# Patient Record
Sex: Female | Born: 1955 | Race: Black or African American | Hispanic: No | Marital: Single | Smoking: Former smoker
Health system: Southern US, Community
[De-identification: ages and names within clinical notes are randomized; demographics above are authoritative.]

## PROBLEM LIST (undated history)

## (undated) DIAGNOSIS — E119 Type 2 diabetes mellitus without complications: Secondary | ICD-10-CM

## (undated) DIAGNOSIS — K219 Gastro-esophageal reflux disease without esophagitis: Secondary | ICD-10-CM

## (undated) DIAGNOSIS — N289 Disorder of kidney and ureter, unspecified: Secondary | ICD-10-CM

## (undated) DIAGNOSIS — I2510 Atherosclerotic heart disease of native coronary artery without angina pectoris: Secondary | ICD-10-CM

## (undated) DIAGNOSIS — I10 Essential (primary) hypertension: Secondary | ICD-10-CM

## (undated) HISTORY — PX: CARDIAC SURGERY: SHX584

## (undated) HISTORY — PX: ABDOMINAL HYSTERECTOMY: SHX81

## (undated) HISTORY — PX: CARPAL TUNNEL RELEASE: SHX101

## (undated) HISTORY — PX: TRIGGER FINGER RELEASE: SHX641

## (undated) HISTORY — PX: CORONARY ANGIOPLASTY WITH STENT PLACEMENT: SHX49

## (undated) HISTORY — PX: BACK SURGERY: SHX140

## (undated) HISTORY — PX: REVISION OF SCAR TISSUE RECTUS MUSCLE: SHX2351

---

## 2006-08-03 ENCOUNTER — Other Ambulatory Visit: Payer: Self-pay

## 2006-08-03 ENCOUNTER — Emergency Department: Payer: Self-pay | Admitting: Emergency Medicine

## 2007-03-11 ENCOUNTER — Emergency Department: Payer: Self-pay | Admitting: Emergency Medicine

## 2007-03-11 ENCOUNTER — Other Ambulatory Visit: Payer: Self-pay

## 2007-04-21 ENCOUNTER — Emergency Department: Payer: Self-pay | Admitting: Emergency Medicine

## 2007-04-21 ENCOUNTER — Other Ambulatory Visit: Payer: Self-pay

## 2007-04-22 ENCOUNTER — Ambulatory Visit: Payer: Self-pay | Admitting: Emergency Medicine

## 2007-12-07 ENCOUNTER — Emergency Department: Payer: Self-pay | Admitting: Emergency Medicine

## 2008-01-19 ENCOUNTER — Emergency Department: Payer: Self-pay | Admitting: Emergency Medicine

## 2008-03-08 ENCOUNTER — Emergency Department: Payer: Self-pay | Admitting: Emergency Medicine

## 2008-03-24 ENCOUNTER — Emergency Department: Payer: Self-pay | Admitting: Emergency Medicine

## 2008-03-24 ENCOUNTER — Other Ambulatory Visit: Payer: Self-pay

## 2008-07-03 ENCOUNTER — Emergency Department: Payer: Self-pay | Admitting: Emergency Medicine

## 2008-08-25 ENCOUNTER — Emergency Department: Payer: Self-pay | Admitting: Emergency Medicine

## 2008-09-05 ENCOUNTER — Inpatient Hospital Stay: Payer: Self-pay | Admitting: Specialist

## 2008-10-29 IMAGING — CR DG CHEST 1V PORT
1 series · 1 of 1 positions shown · non-contrast
Comparison: none

REASON FOR EXAM: shortnness of breath and discomfort fo chest
COMMENTS:

PROCEDURE:     DXR - DXR PORTABLE CHEST SINGLE VIEW  - March 11, 2007 [DATE]
RESULT:     Comparison is made to the prior exam of 08/03/2006.  The lung
fields are clear. The heart, mediastinal and osseous structures show no
significant abnormalities.

[view not recorded]
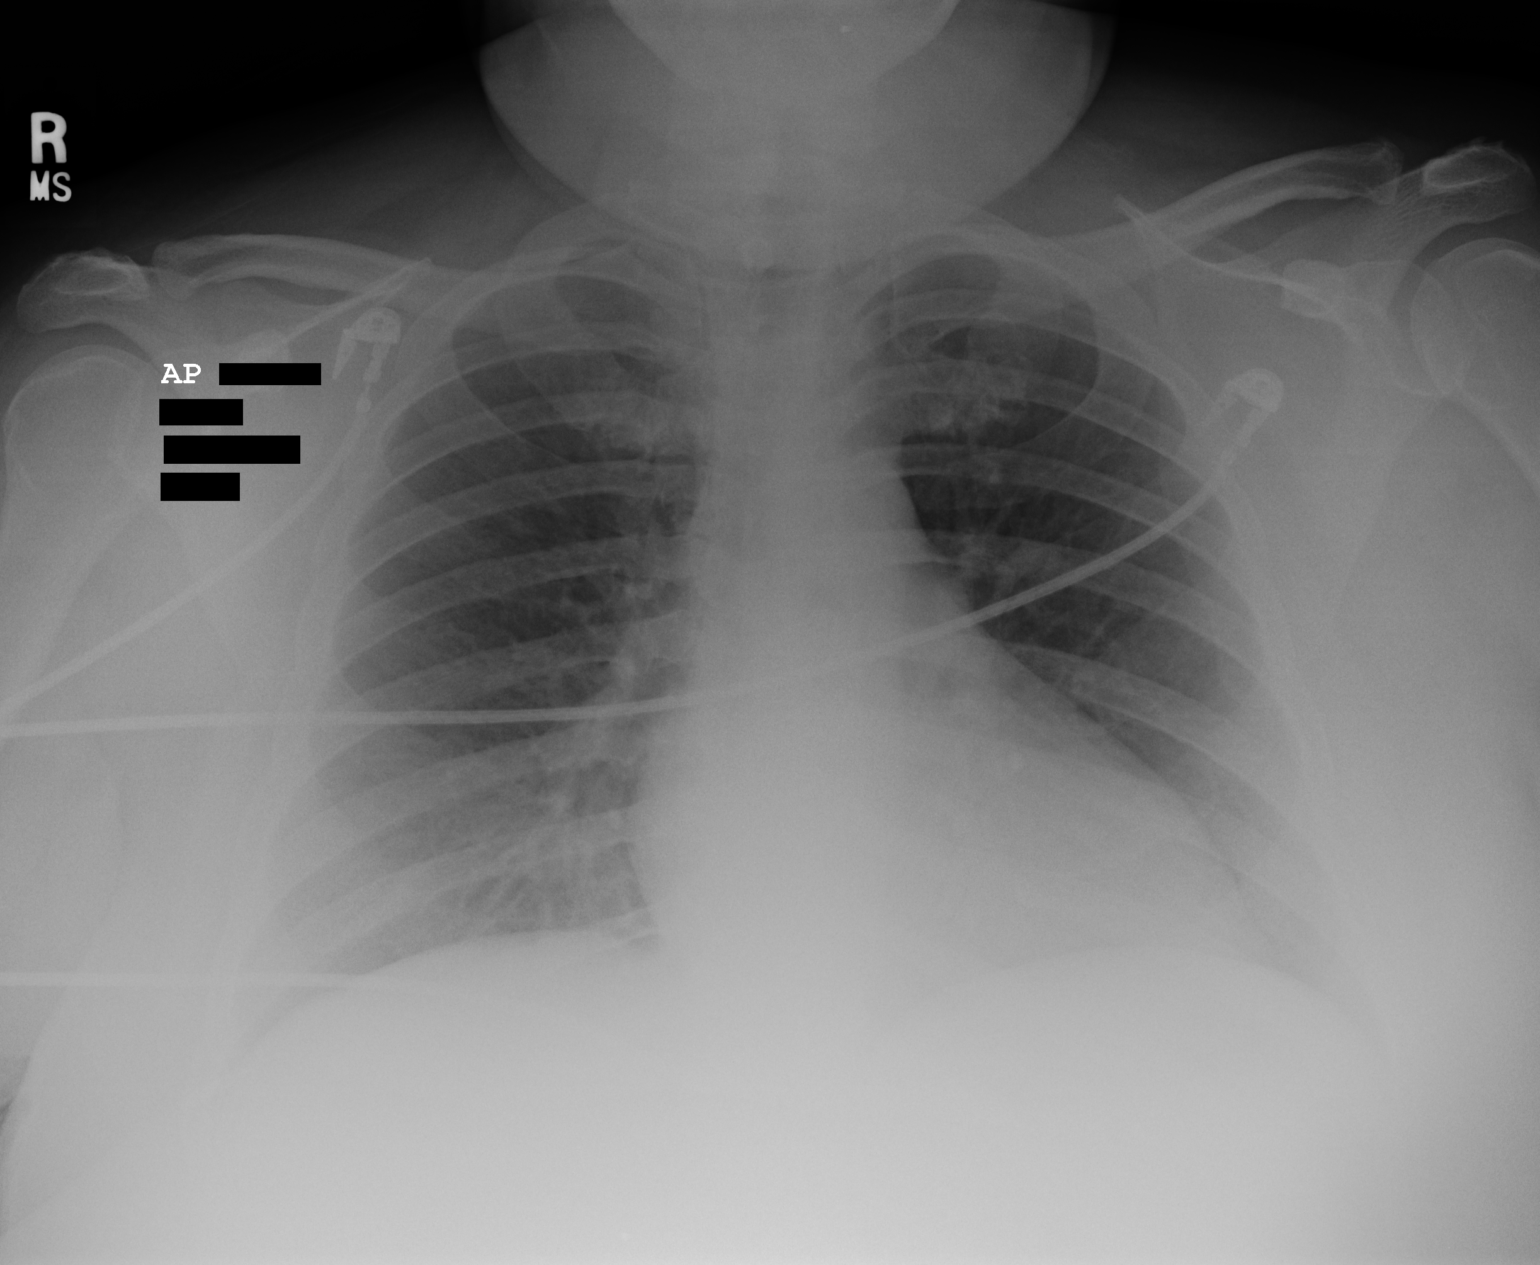

[1 of 1 positions shown; findings below may reference images not displayed]

IMPRESSION: 1.     No significant abnormalities are noted.
2.     Monitoring electrodes are present.

## 2008-12-09 IMAGING — CR DG CHEST 2V
1 series · 3 of 3 positions shown · non-contrast
Comparison: none

REASON FOR EXAM: chest pain
COMMENTS:

PROCEDURE:     DXR - DXR CHEST PA (OR AP) AND LATERAL  - April 21, 2007  [DATE]
RESULT:     Comparison is made to the prior exam of 03/11/2007. The lung
fields are clear. No pneumonia, pneumothorax or pleural effusion is seen.
Heart size is normal.

[Series 1: view not recorded · 0.17mm/px · 3 of 3 slices shown]
[im 1/3]
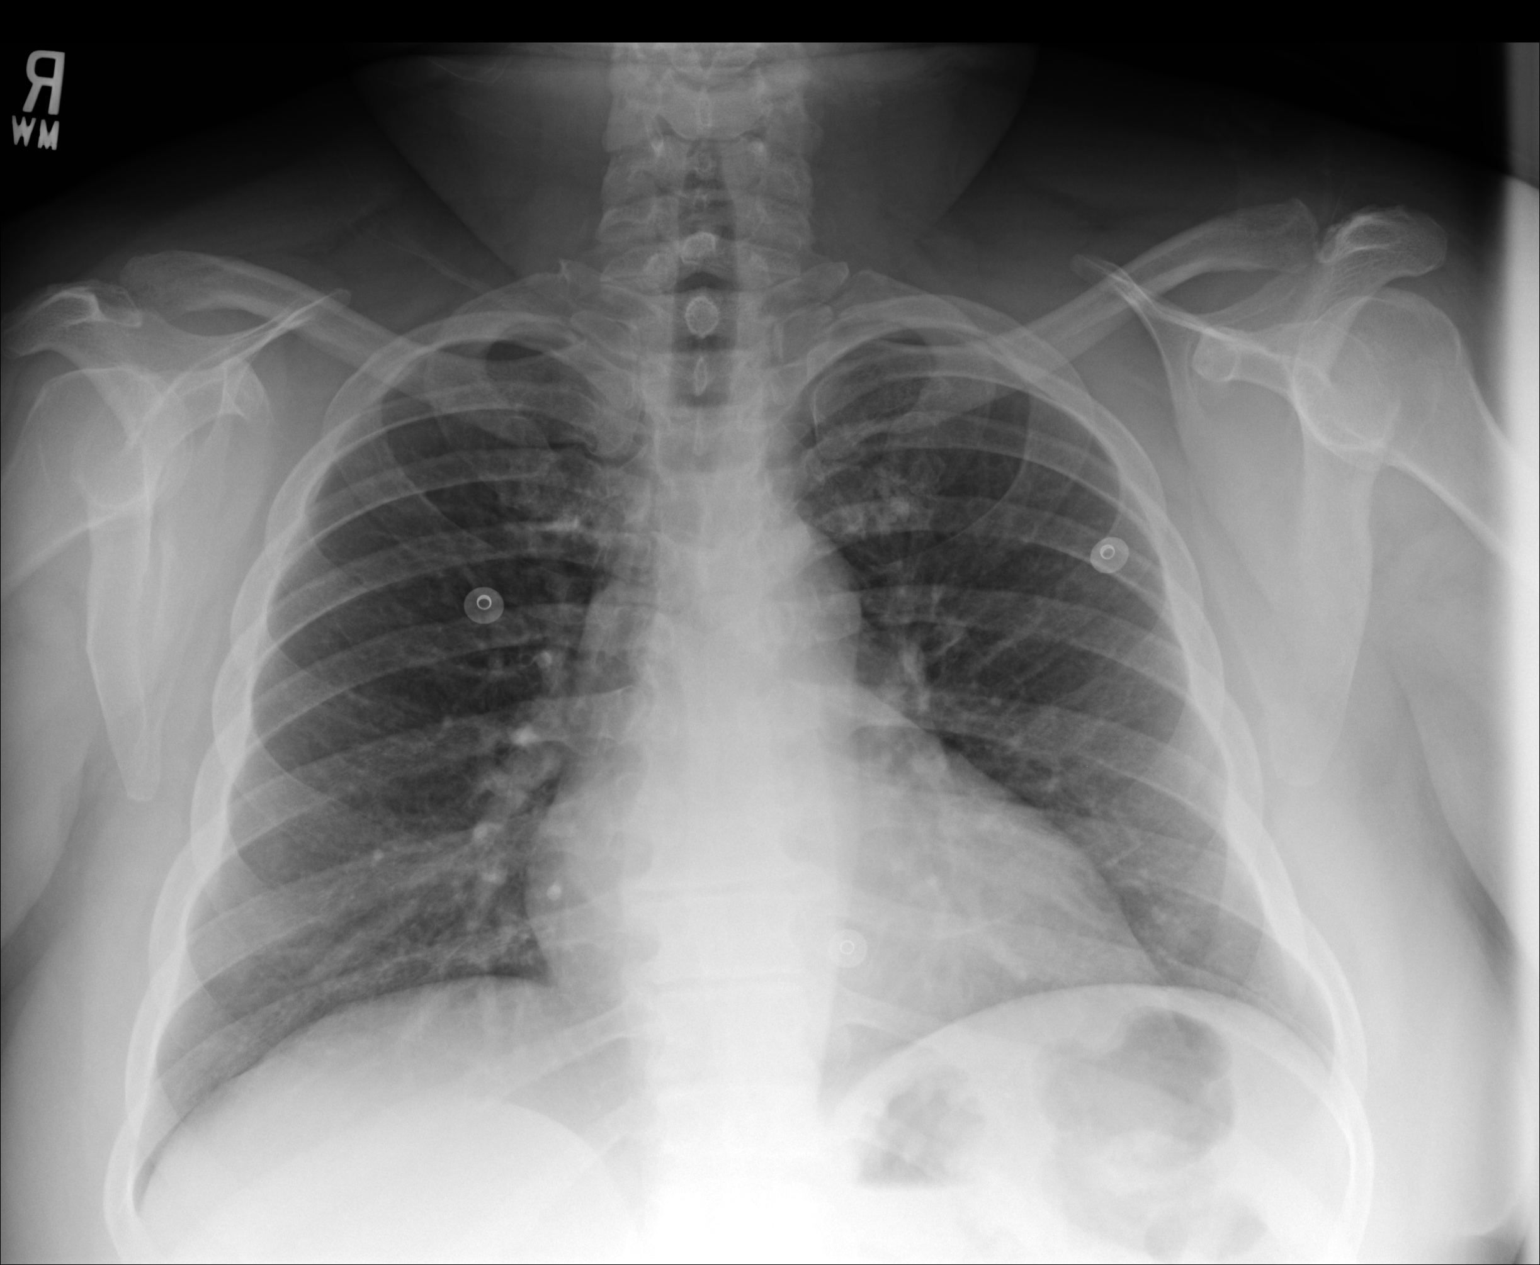
[im 2/3]
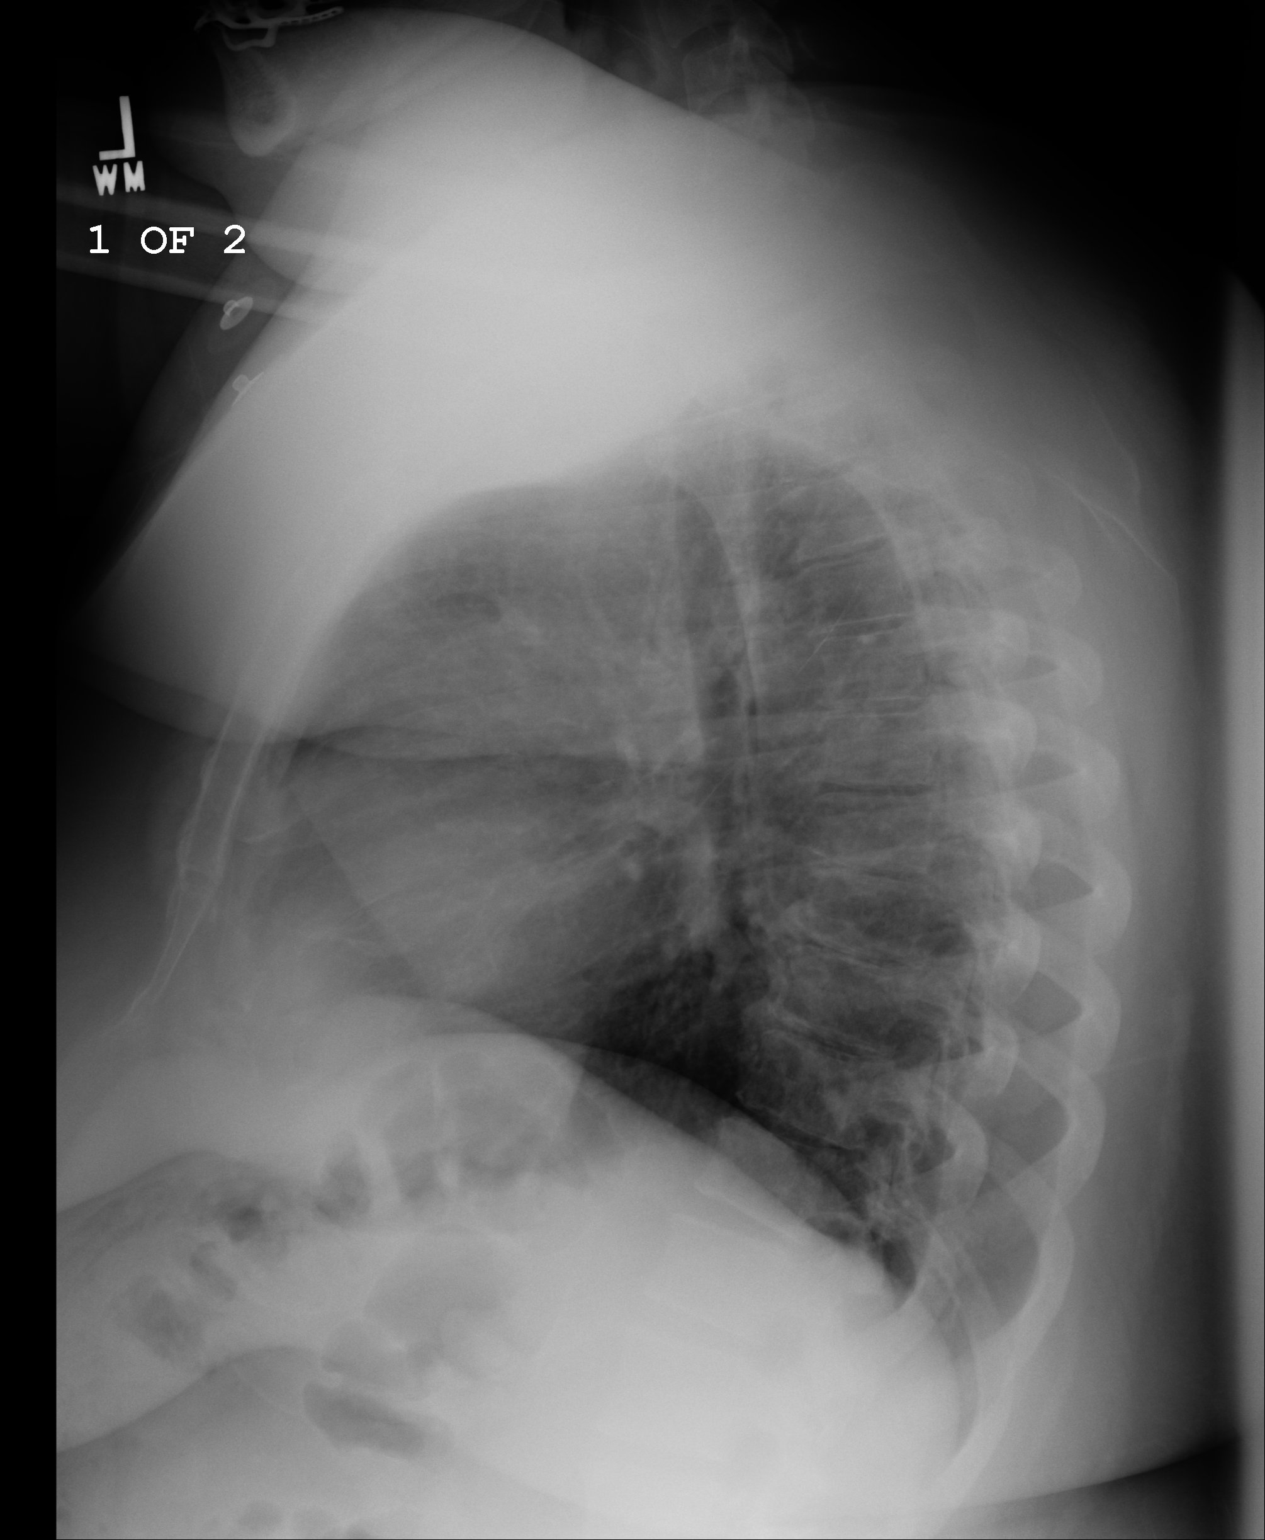
[im 3/3]
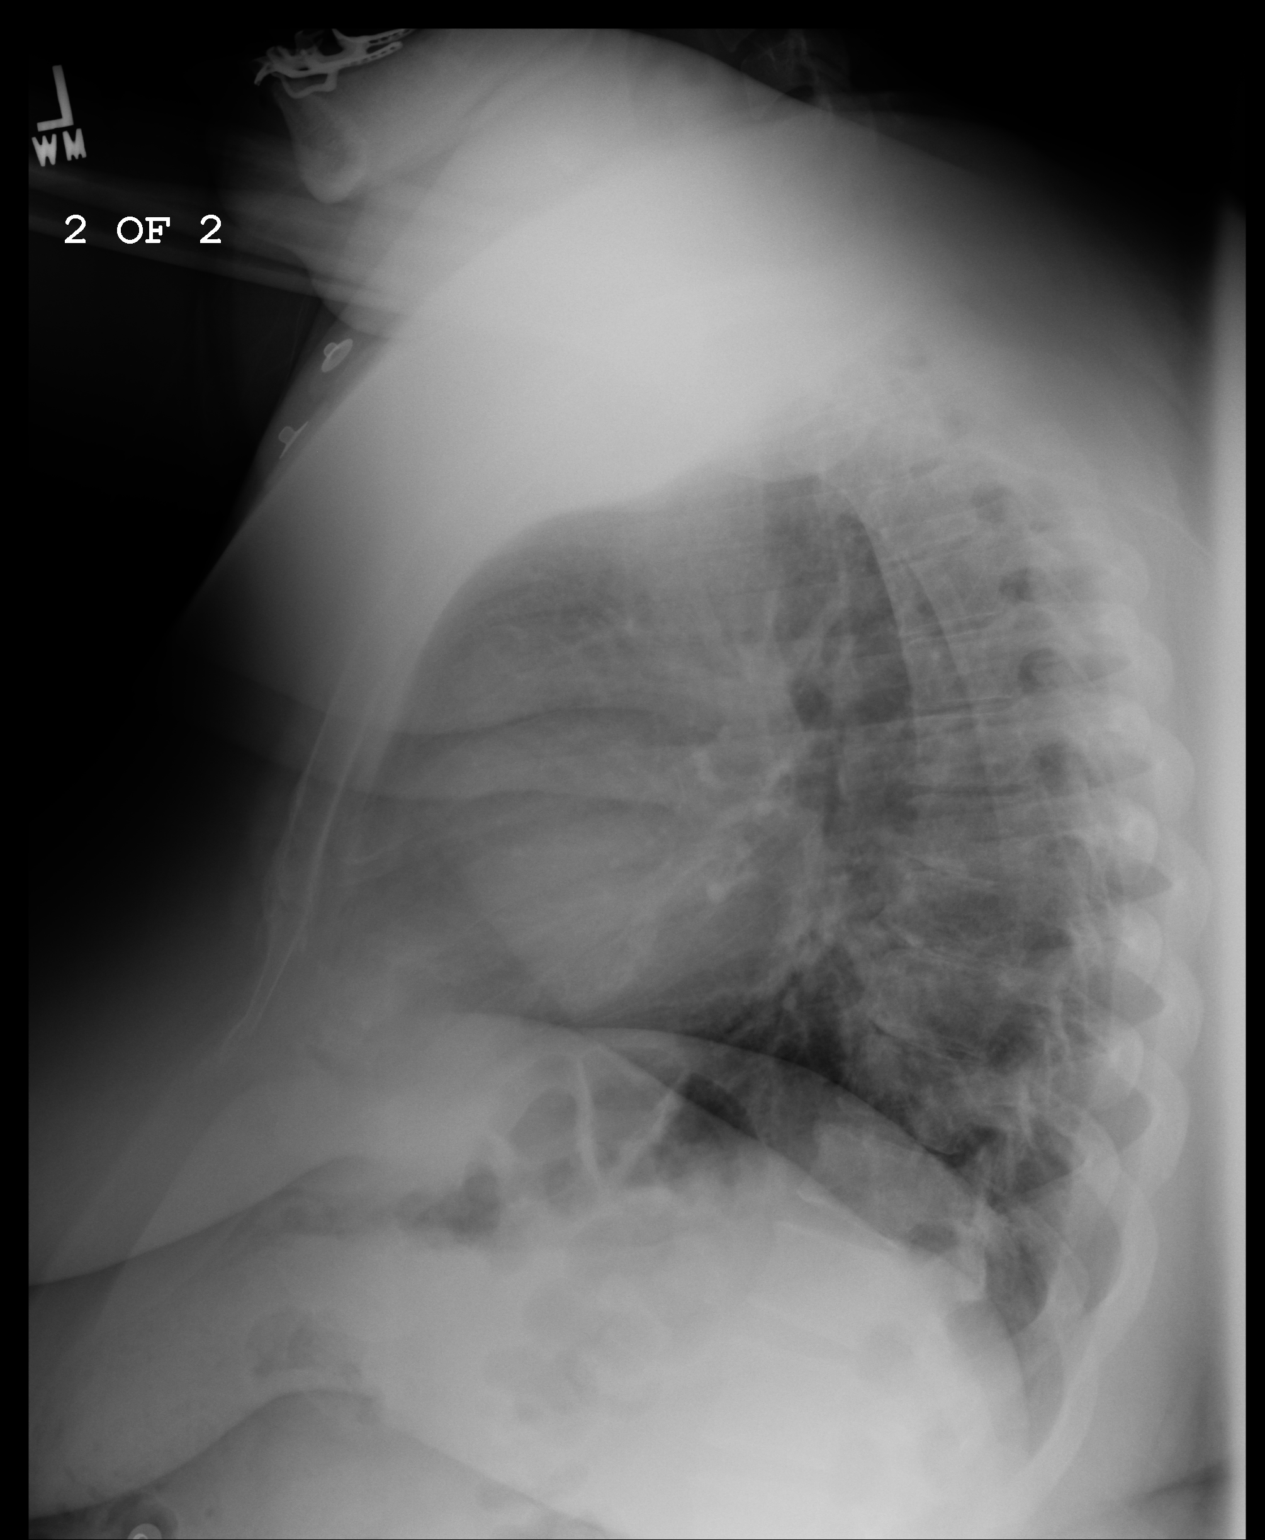

[3 of 3 positions shown; findings below may reference images not displayed]

IMPRESSION: 1.     No acute changes are identified.

## 2009-02-27 ENCOUNTER — Ambulatory Visit: Payer: Self-pay | Admitting: Family Medicine

## 2009-03-01 ENCOUNTER — Emergency Department: Payer: Self-pay | Admitting: Emergency Medicine

## 2009-03-15 ENCOUNTER — Ambulatory Visit: Payer: Self-pay | Admitting: Gastroenterology

## 2009-07-24 ENCOUNTER — Ambulatory Visit: Payer: Self-pay | Admitting: Unknown Physician Specialty

## 2009-08-01 ENCOUNTER — Inpatient Hospital Stay: Payer: Self-pay | Admitting: Unknown Physician Specialty

## 2009-08-05 ENCOUNTER — Emergency Department: Payer: Self-pay | Admitting: Emergency Medicine

## 2009-09-01 ENCOUNTER — Emergency Department: Payer: Self-pay | Admitting: Unknown Physician Specialty

## 2010-03-15 ENCOUNTER — Emergency Department: Payer: Self-pay | Admitting: Emergency Medicine

## 2010-04-02 ENCOUNTER — Observation Stay: Payer: Self-pay | Admitting: Internal Medicine

## 2010-04-26 IMAGING — CR DG CHEST 1V PORT
1 series · 1 of 1 positions shown · non-contrast
Comparison: none

REASON FOR EXAM: chest pain
COMMENTS:

PROCEDURE:     DXR - DXR PORTABLE CHEST SINGLE VIEW  - September 05, 2008  [DATE]
RESULT:     Comparison is made to the prior exam of 03/24/2008. The lung
fields are clear. The heart, mediastinal and osseous structures show no
acute changes. Monitoring electrodes are present.

[view not recorded]
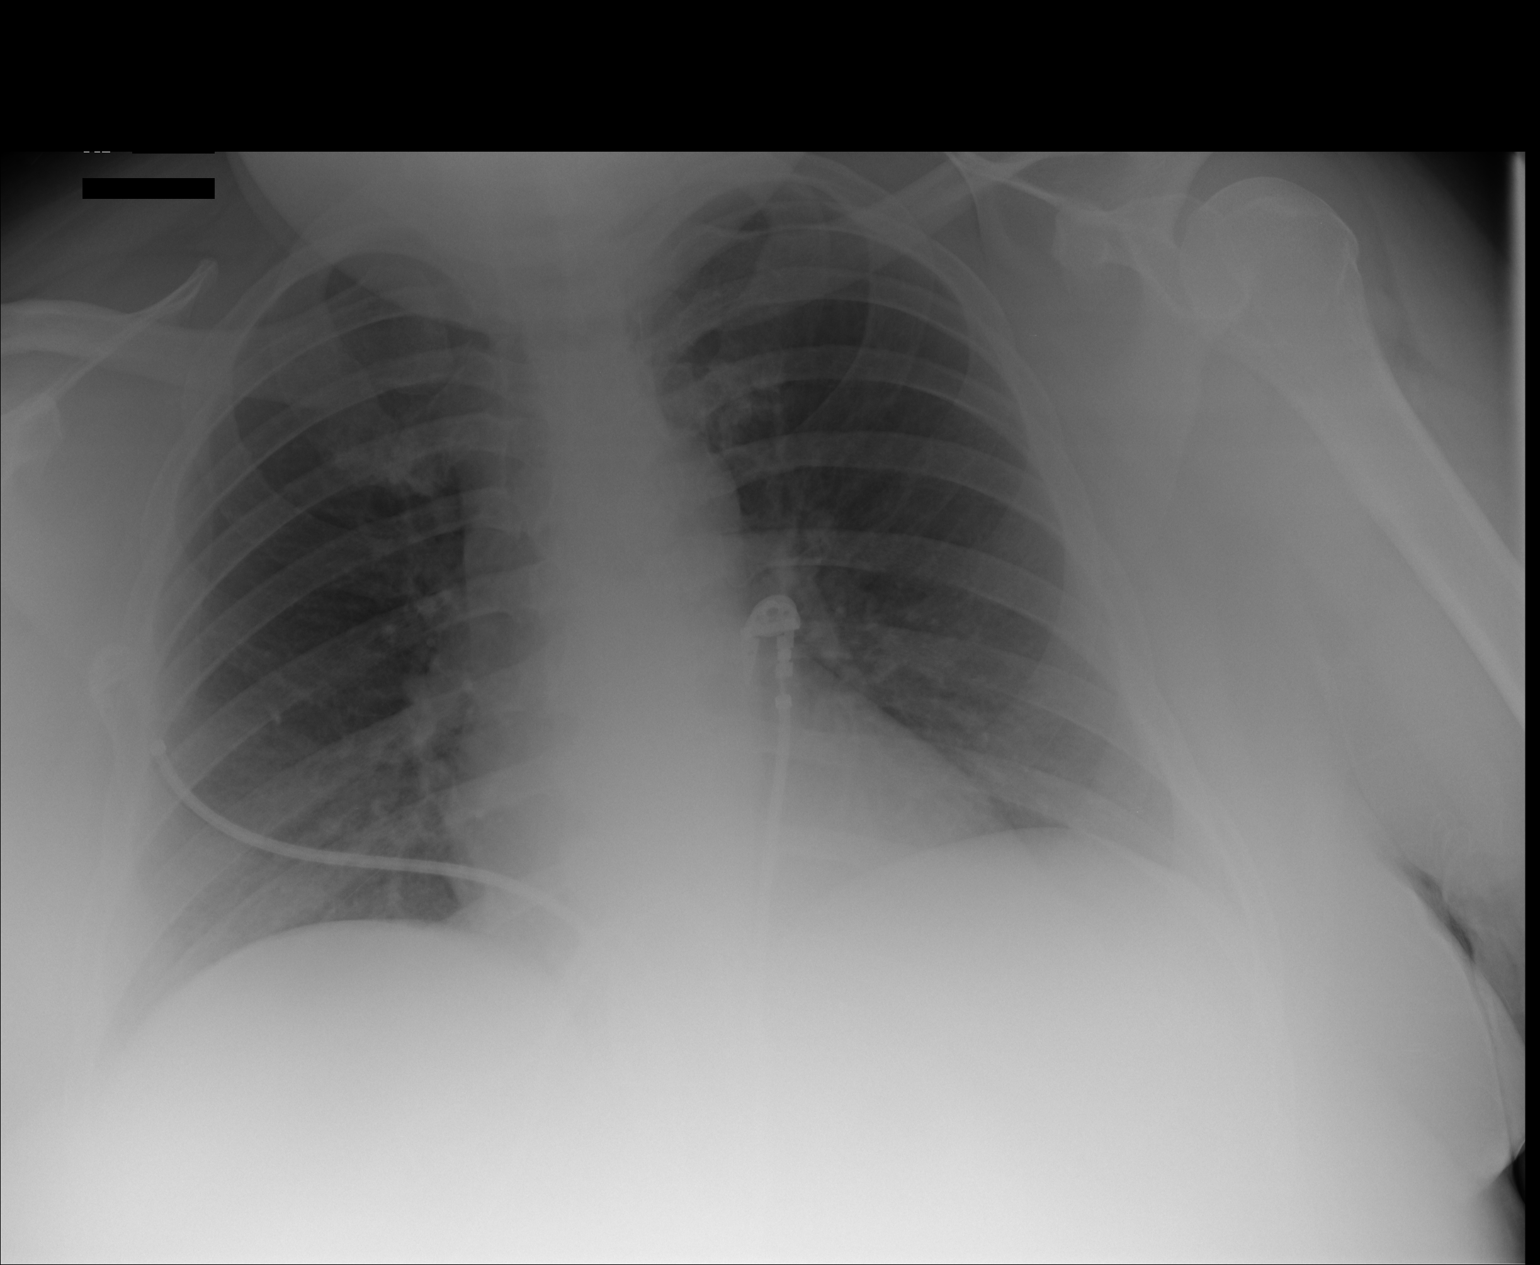

[1 of 1 positions shown; findings below may reference images not displayed]

IMPRESSION: No acute changes are identified.

## 2010-05-20 ENCOUNTER — Ambulatory Visit: Payer: Self-pay | Admitting: Gastroenterology

## 2010-05-28 ENCOUNTER — Ambulatory Visit: Payer: Self-pay | Admitting: Internal Medicine

## 2010-09-15 ENCOUNTER — Emergency Department: Payer: Self-pay | Admitting: Emergency Medicine

## 2010-10-10 ENCOUNTER — Inpatient Hospital Stay: Payer: Self-pay | Admitting: Internal Medicine

## 2010-12-05 ENCOUNTER — Ambulatory Visit: Payer: Self-pay | Admitting: Family Medicine

## 2011-03-14 IMAGING — CR DG CHEST 2V
1 series · 3 of 3 positions shown · non-contrast
Comparison: none

REASON FOR EXAM: [DATE]----HTN
COMMENTS:

PROCEDURE:     DXR - DXR CHEST PA (OR AP) AND LATERAL  - July 24, 2009  [DATE]
RESULT:     The lungs are clear. The cardiac silhouette and visualized bony
skeleton are unremarkable.

[Series 1: view not recorded · 0.17mm/px · 3 of 3 slices shown]
[im 1/3]
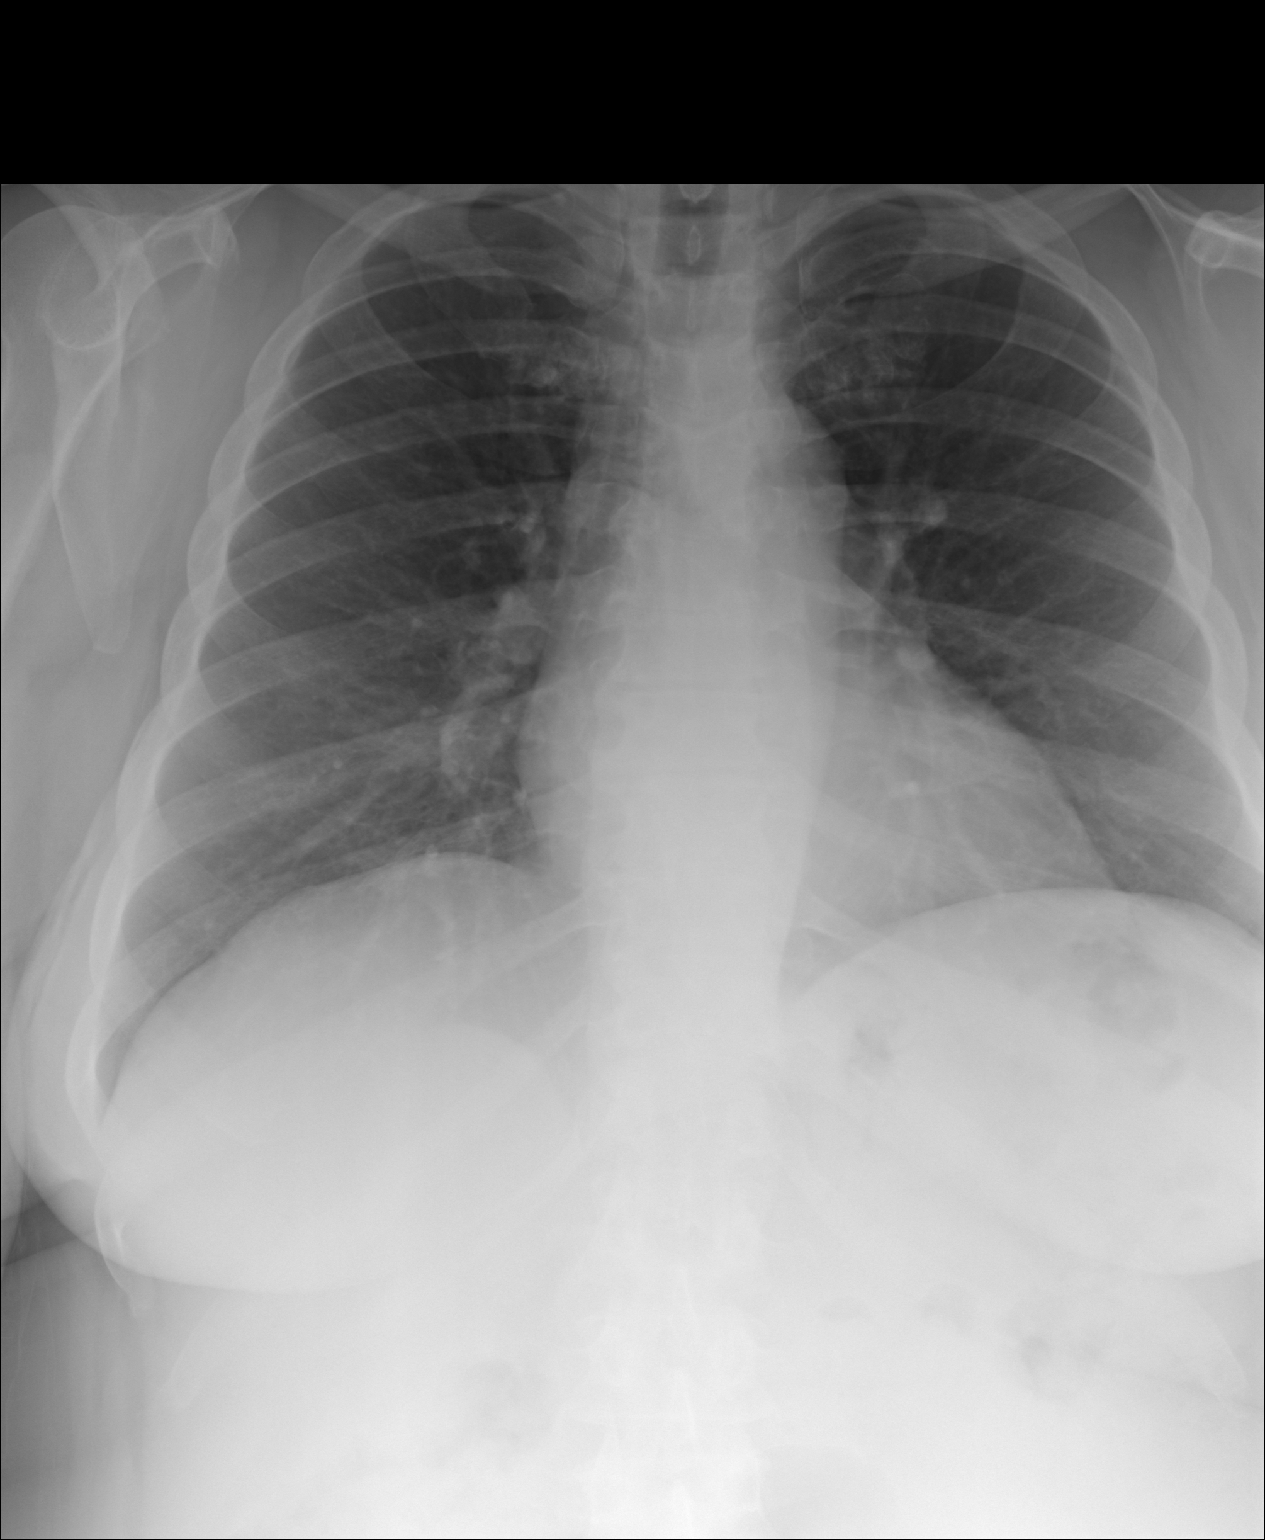
[im 2/3]
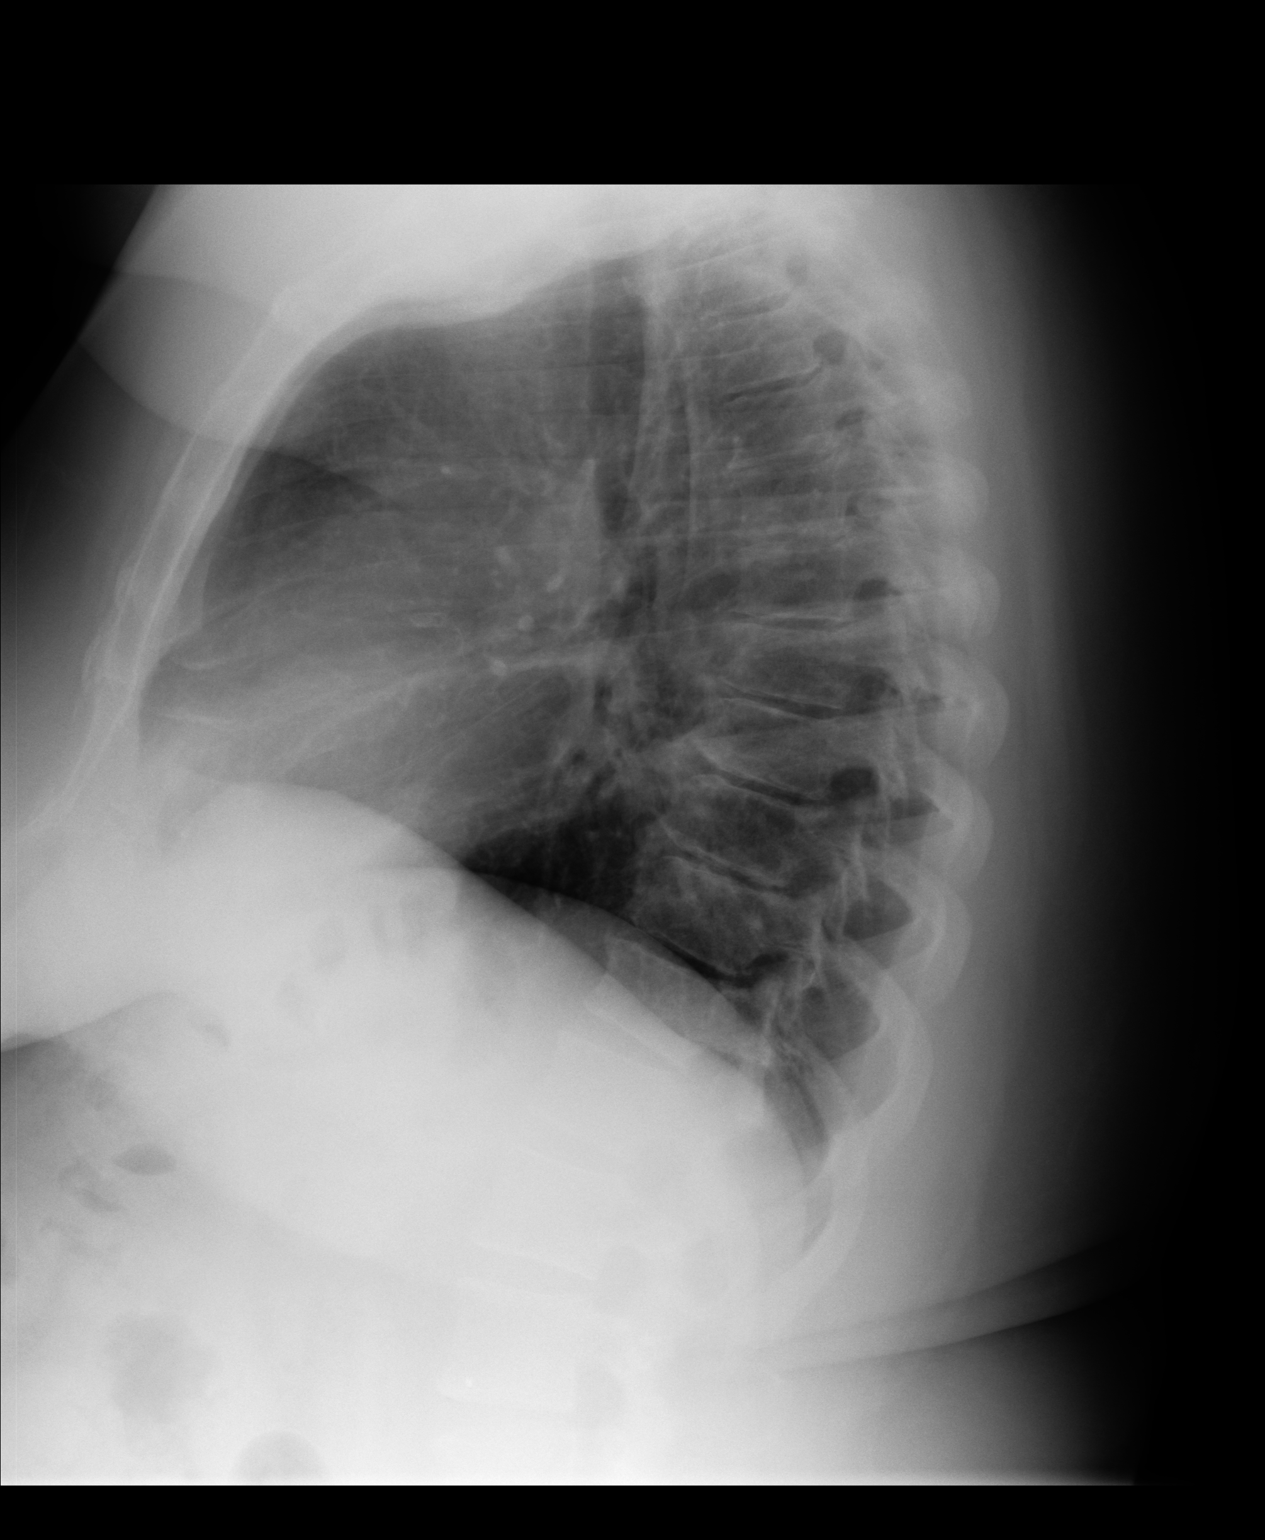
[im 3/3]
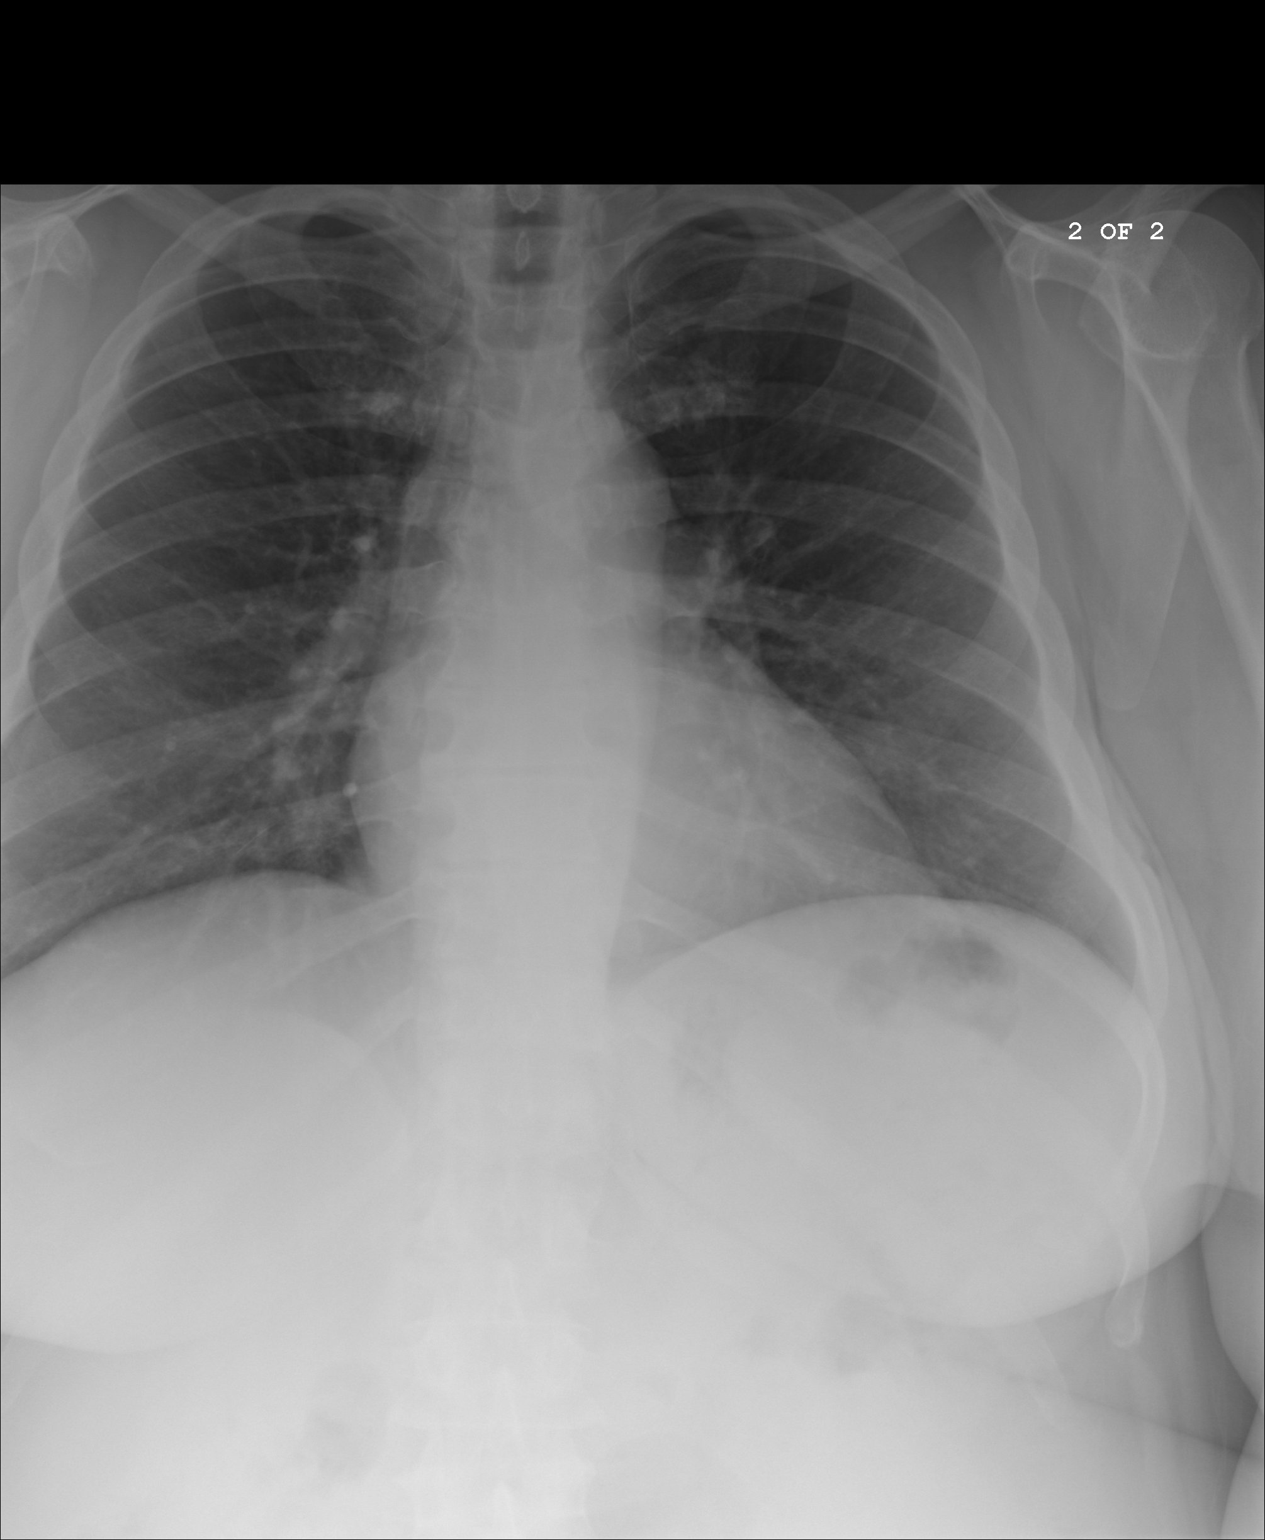

[3 of 3 positions shown; findings below may reference images not displayed]

IMPRESSION: 1. Chest radiograph without evidence of acute cardiopulmonary disease.
2. Comparison made to a prior study dated 09/05/2008.

## 2020-08-19 ENCOUNTER — Encounter: Payer: Self-pay | Admitting: Emergency Medicine

## 2020-08-19 ENCOUNTER — Emergency Department: Payer: 59

## 2020-08-19 ENCOUNTER — Observation Stay
Admission: EM | Admit: 2020-08-19 | Discharge: 2020-08-20 | Disposition: A | Discharge status: Home / Self Care | Payer: 59 | Attending: Internal Medicine | Admitting: Internal Medicine

## 2020-08-19 ENCOUNTER — Other Ambulatory Visit: Payer: Self-pay

## 2020-08-19 DIAGNOSIS — Z794 Long term (current) use of insulin: Secondary | ICD-10-CM | POA: Insufficient documentation

## 2020-08-19 DIAGNOSIS — Z20822 Contact with and (suspected) exposure to covid-19: Secondary | ICD-10-CM | POA: Diagnosis not present

## 2020-08-19 DIAGNOSIS — E1122 Type 2 diabetes mellitus with diabetic chronic kidney disease: Secondary | ICD-10-CM | POA: Diagnosis not present

## 2020-08-19 DIAGNOSIS — I25118 Atherosclerotic heart disease of native coronary artery with other forms of angina pectoris: Secondary | ICD-10-CM | POA: Diagnosis not present

## 2020-08-19 DIAGNOSIS — I129 Hypertensive chronic kidney disease with stage 1 through stage 4 chronic kidney disease, or unspecified chronic kidney disease: Secondary | ICD-10-CM | POA: Insufficient documentation

## 2020-08-19 DIAGNOSIS — Z7982 Long term (current) use of aspirin: Secondary | ICD-10-CM | POA: Diagnosis not present

## 2020-08-19 DIAGNOSIS — Z87891 Personal history of nicotine dependence: Secondary | ICD-10-CM | POA: Insufficient documentation

## 2020-08-19 DIAGNOSIS — I2510 Atherosclerotic heart disease of native coronary artery without angina pectoris: Secondary | ICD-10-CM | POA: Diagnosis not present

## 2020-08-19 DIAGNOSIS — N184 Chronic kidney disease, stage 4 (severe): Secondary | ICD-10-CM | POA: Diagnosis not present

## 2020-08-19 DIAGNOSIS — E1143 Type 2 diabetes mellitus with diabetic autonomic (poly)neuropathy: Secondary | ICD-10-CM | POA: Diagnosis not present

## 2020-08-19 DIAGNOSIS — Z79899 Other long term (current) drug therapy: Secondary | ICD-10-CM | POA: Insufficient documentation

## 2020-08-19 DIAGNOSIS — E039 Hypothyroidism, unspecified: Secondary | ICD-10-CM | POA: Insufficient documentation

## 2020-08-19 DIAGNOSIS — R0602 Shortness of breath: Secondary | ICD-10-CM | POA: Insufficient documentation

## 2020-08-19 DIAGNOSIS — I160 Hypertensive urgency: Secondary | ICD-10-CM | POA: Diagnosis not present

## 2020-08-19 DIAGNOSIS — R079 Chest pain, unspecified: Secondary | ICD-10-CM | POA: Diagnosis present

## 2020-08-19 HISTORY — DX: Atherosclerotic heart disease of native coronary artery without angina pectoris: I25.10

## 2020-08-19 HISTORY — DX: Gastro-esophageal reflux disease without esophagitis: K21.9

## 2020-08-19 HISTORY — DX: Type 2 diabetes mellitus without complications: E11.9

## 2020-08-19 HISTORY — DX: Essential (primary) hypertension: I10

## 2020-08-19 HISTORY — DX: Disorder of kidney and ureter, unspecified: N28.9

## 2020-08-19 LAB — CBC WITH DIFFERENTIAL/PLATELET
Abs Immature Granulocytes: 0.03 10*3/uL (ref 0.00–0.07)
Basophils Absolute: 0 10*3/uL (ref 0.0–0.1)
Basophils Relative: 0 %
Eosinophils Absolute: 0.2 10*3/uL (ref 0.0–0.5)
Eosinophils Relative: 3 %
HCT: 31.5 % — ABNORMAL LOW (ref 36.0–46.0)
Hemoglobin: 10.5 g/dL — ABNORMAL LOW (ref 12.0–15.0)
Immature Granulocytes: 0 %
Lymphocytes Relative: 18 %
Lymphs Abs: 1.3 10*3/uL (ref 0.7–4.0)
MCH: 29.8 pg (ref 26.0–34.0)
MCHC: 33.3 g/dL (ref 30.0–36.0)
MCV: 89.5 fL (ref 80.0–100.0)
Monocytes Absolute: 0.4 10*3/uL (ref 0.1–1.0)
Monocytes Relative: 6 %
Neutro Abs: 5.3 10*3/uL (ref 1.7–7.7)
Neutrophils Relative %: 73 %
Platelets: 215 10*3/uL (ref 150–400)
RBC: 3.52 MIL/uL — ABNORMAL LOW (ref 3.87–5.11)
RDW: 12.2 % (ref 11.5–15.5)
WBC: 7.3 10*3/uL (ref 4.0–10.5)
nRBC: 0 % (ref 0.0–0.2)

## 2020-08-19 LAB — COMPREHENSIVE METABOLIC PANEL
ALT: 14 U/L (ref 0–44)
AST: 17 U/L (ref 15–41)
Albumin: 4.1 g/dL (ref 3.5–5.0)
Alkaline Phosphatase: 67 U/L (ref 38–126)
Anion gap: 10 (ref 5–15)
BUN: 60 mg/dL — ABNORMAL HIGH (ref 8–23)
CO2: 23 mmol/L (ref 22–32)
Calcium: 9 mg/dL (ref 8.9–10.3)
Chloride: 103 mmol/L (ref 98–111)
Creatinine, Ser: 2.71 mg/dL — ABNORMAL HIGH (ref 0.44–1.00)
GFR, Estimated: 19 mL/min — ABNORMAL LOW (ref >60)
Glucose, Bld: 284 mg/dL — ABNORMAL HIGH (ref 70–99)
Potassium: 4.7 mmol/L (ref 3.5–5.1)
Sodium: 136 mmol/L (ref 135–145)
Total Bilirubin: 0.8 mg/dL (ref 0.3–1.2)
Total Protein: 8.2 g/dL — ABNORMAL HIGH (ref 6.5–8.1)

## 2020-08-19 LAB — TROPONIN I (HIGH SENSITIVITY)
Troponin I (High Sensitivity): 10 ng/L (ref <18)
Troponin I (High Sensitivity): 22 ng/L — ABNORMAL HIGH (ref <18)

## 2020-08-19 LAB — GLUCOSE, CAPILLARY: Glucose-Capillary: 258 mg/dL — ABNORMAL HIGH (ref 70–99)

## 2020-08-19 LAB — BRAIN NATRIURETIC PEPTIDE: B Natriuretic Peptide: 43.3 pg/mL (ref 0.0–100.0)

## 2020-08-19 LAB — PROTIME-INR
INR: 1 (ref 0.8–1.2)
Prothrombin Time: 12.7 seconds (ref 11.4–15.2)

## 2020-08-19 LAB — TSH: TSH: 2.989 u[IU]/mL (ref 0.350–4.500)

## 2020-08-19 MED ORDER — ZOLPIDEM TARTRATE 5 MG PO TABS: 5.0000 mg | ORAL_TABLET | Freq: Every evening | ORAL | Status: DC | PRN

## 2020-08-19 MED ORDER — LABETALOL HCL 5 MG/ML IV SOLN: 20.0000 mg | INTRAVENOUS | Status: DC | PRN

## 2020-08-19 MED ORDER — VITAMIN D3 25 MCG (1000 UNIT) PO TABS
2000.0000 [IU] | ORAL_TABLET | Freq: Every day | ORAL | Status: DC
  Administered 2020-08-19 – 2020-08-20 (×2): 2000 [IU] via ORAL
  Filled 2020-08-19 (×3): qty 2

## 2020-08-19 MED ORDER — METOPROLOL TARTRATE 25 MG/10 ML ORAL SUSPENSION
12.5000 mg | Freq: Two times a day (BID) | ORAL | Status: DC
  Administered 2020-08-19: 12.5 mg via ORAL
  Filled 2020-08-19 (×4): qty 5

## 2020-08-19 MED ORDER — CLONIDINE HCL 0.1 MG PO TABS: 0.1000 mg | ORAL_TABLET | Freq: Every day | ORAL | Status: DC

## 2020-08-19 MED ORDER — FUROSEMIDE 40 MG PO TABS
40.0000 mg | ORAL_TABLET | Freq: Two times a day (BID) | ORAL | Status: DC
  Administered 2020-08-19 – 2020-08-20 (×2): 40 mg via ORAL
  Filled 2020-08-19 (×2): qty 1

## 2020-08-19 MED ORDER — NITROGLYCERIN 0.4 MG SL SUBL
0.4000 mg | SUBLINGUAL_TABLET | SUBLINGUAL | Status: DC | PRN
  Administered 2020-08-20 (×3): 0.4 mg via SUBLINGUAL
  Filled 2020-08-19: qty 1

## 2020-08-19 MED ORDER — NITROGLYCERIN 2 % TD OINT
1.0000 [in_us] | TOPICAL_OINTMENT | TRANSDERMAL | Status: DC
  Filled 2020-08-19: qty 1

## 2020-08-19 MED ORDER — ALPRAZOLAM 0.25 MG PO TABS: 0.2500 mg | ORAL_TABLET | Freq: Two times a day (BID) | ORAL | Status: DC | PRN

## 2020-08-19 MED ORDER — ALBUTEROL SULFATE HFA 108 (90 BASE) MCG/ACT IN AERS
2.0000 | INHALATION_SPRAY | RESPIRATORY_TRACT | Status: DC | PRN
  Filled 2020-08-19: qty 6.7

## 2020-08-19 MED ORDER — CLONIDINE HCL 0.1 MG PO TABS
0.1000 mg | ORAL_TABLET | Freq: Three times a day (TID) | ORAL | Status: DC
  Administered 2020-08-19 – 2020-08-20 (×2): 0.1 mg via ORAL
  Filled 2020-08-19 (×2): qty 1

## 2020-08-19 MED ORDER — SODIUM CHLORIDE 0.9 % IV SOLN: INTRAVENOUS | Status: DC

## 2020-08-19 MED ORDER — LIDOCAINE VISCOUS HCL 2 % MT SOLN
15.0000 mL | Freq: Once | OROMUCOSAL | Status: AC
  Administered 2020-08-19: 15 mL via ORAL
  Filled 2020-08-19: qty 15

## 2020-08-19 MED ORDER — LISINOPRIL 20 MG PO TABS
20.0000 mg | ORAL_TABLET | Freq: Every day | ORAL | Status: DC
  Administered 2020-08-20: 20 mg via ORAL
  Filled 2020-08-19: qty 1

## 2020-08-19 MED ORDER — LEVOTHYROXINE SODIUM 50 MCG PO TABS
75.0000 ug | ORAL_TABLET | Freq: Every day | ORAL | Status: DC
  Administered 2020-08-20: 75 ug via ORAL
  Filled 2020-08-19: qty 1

## 2020-08-19 MED ORDER — SUCRALFATE 1 G PO TABS
1.0000 g | ORAL_TABLET | Freq: Three times a day (TID) | ORAL | Status: DC
  Administered 2020-08-19 – 2020-08-20 (×3): 1 g via ORAL
  Filled 2020-08-19 (×3): qty 1

## 2020-08-19 MED ORDER — INSULIN ASPART 100 UNIT/ML ~~LOC~~ SOLN
0.0000 [IU] | Freq: Three times a day (TID) | SUBCUTANEOUS | Status: DC
  Administered 2020-08-20: 3 [IU] via SUBCUTANEOUS
  Filled 2020-08-19 (×2): qty 1

## 2020-08-19 MED ORDER — SENNA 8.6 MG PO TABS
8.6000 mg | ORAL_TABLET | Freq: Every morning | ORAL | Status: DC
  Administered 2020-08-20: 8.6 mg via ORAL
  Filled 2020-08-19: qty 1

## 2020-08-19 MED ORDER — ONDANSETRON HCL 4 MG/2ML IJ SOLN: 4.0000 mg | Freq: Four times a day (QID) | INTRAMUSCULAR | Status: DC | PRN

## 2020-08-19 MED ORDER — LISINOPRIL 10 MG PO TABS
10.0000 mg | ORAL_TABLET | Freq: Once | ORAL | Status: DC
  Filled 2020-08-19: qty 1

## 2020-08-19 MED ORDER — PANTOPRAZOLE SODIUM 40 MG PO TBEC
40.0000 mg | DELAYED_RELEASE_TABLET | Freq: Every day | ORAL | Status: DC
  Administered 2020-08-20: 40 mg via ORAL
  Filled 2020-08-19: qty 1

## 2020-08-19 MED ORDER — CYCLOBENZAPRINE HCL 10 MG PO TABS: 5.0000 mg | ORAL_TABLET | Freq: Three times a day (TID) | ORAL | Status: DC | PRN

## 2020-08-19 MED ORDER — ALUM & MAG HYDROXIDE-SIMETH 200-200-20 MG/5ML PO SUSP
30.0000 mL | Freq: Once | ORAL | Status: AC
  Administered 2020-08-19: 30 mL via ORAL
  Filled 2020-08-19: qty 30

## 2020-08-19 MED ORDER — ENOXAPARIN SODIUM 40 MG/0.4ML ~~LOC~~ SOLN
40.0000 mg | SUBCUTANEOUS | Status: DC
  Administered 2020-08-19: 40 mg via SUBCUTANEOUS
  Filled 2020-08-19: qty 0.4

## 2020-08-19 MED ORDER — FAMOTIDINE 20 MG PO TABS
20.0000 mg | ORAL_TABLET | Freq: Every day | ORAL | Status: DC
  Administered 2020-08-19: 20 mg via ORAL
  Filled 2020-08-19: qty 1

## 2020-08-19 MED ORDER — ROSUVASTATIN CALCIUM 10 MG PO TABS
10.0000 mg | ORAL_TABLET | Freq: Every day | ORAL | Status: DC
  Administered 2020-08-19: 10 mg via ORAL
  Filled 2020-08-19 (×2): qty 1

## 2020-08-19 MED ORDER — SODIUM BICARBONATE 650 MG PO TABS
650.0000 mg | ORAL_TABLET | Freq: Every day | ORAL | Status: DC
  Administered 2020-08-20: 650 mg via ORAL
  Filled 2020-08-19: qty 1

## 2020-08-19 MED ORDER — ASPIRIN 81 MG PO CHEW
324.0000 mg | CHEWABLE_TABLET | Freq: Once | ORAL | Status: AC
  Administered 2020-08-19: 324 mg via ORAL
  Filled 2020-08-19: qty 4

## 2020-08-19 MED ORDER — CLOPIDOGREL BISULFATE 75 MG PO TABS
75.0000 mg | ORAL_TABLET | Freq: Every day | ORAL | Status: DC
  Administered 2020-08-20: 75 mg via ORAL
  Filled 2020-08-19: qty 1

## 2020-08-19 MED ORDER — INSULIN GLARGINE 100 UNIT/ML ~~LOC~~ SOLN
28.0000 [IU] | Freq: Every day | SUBCUTANEOUS | Status: DC
  Administered 2020-08-19: 28 [IU] via SUBCUTANEOUS
  Filled 2020-08-19 (×2): qty 0.28

## 2020-08-19 MED ORDER — DILTIAZEM HCL ER COATED BEADS 240 MG PO CP24
240.0000 mg | ORAL_CAPSULE | Freq: Every day | ORAL | Status: DC
  Filled 2020-08-19: qty 1
  Filled 2020-08-19: qty 2

## 2020-08-19 MED ORDER — ISOSORBIDE MONONITRATE ER 60 MG PO TB24
60.0000 mg | ORAL_TABLET | Freq: Every day | ORAL | Status: DC
  Administered 2020-08-20: 60 mg via ORAL
  Filled 2020-08-19: qty 1

## 2020-08-19 MED ORDER — ASPIRIN 81 MG PO TBEC: 81.0000 mg | DELAYED_RELEASE_TABLET | Freq: Every day | ORAL | Status: DC

## 2020-08-19 MED ORDER — ACETAMINOPHEN 325 MG PO TABS: 650.0000 mg | ORAL_TABLET | ORAL | Status: DC | PRN

## 2020-08-19 MED ORDER — FERROUS SULFATE 325 (65 FE) MG PO TABS
325.0000 mg | ORAL_TABLET | Freq: Every day | ORAL | Status: DC
  Administered 2020-08-20: 325 mg via ORAL
  Filled 2020-08-19: qty 1

## 2020-08-19 MED ORDER — ASPIRIN EC 81 MG PO TBEC
81.0000 mg | DELAYED_RELEASE_TABLET | Freq: Every day | ORAL | Status: DC
  Administered 2020-08-20: 81 mg via ORAL
  Filled 2020-08-19: qty 1

## 2020-08-19 MED ORDER — INFLUENZA VAC SPLIT QUAD 0.5 ML IM SUSY: 0.5000 mL | PREFILLED_SYRINGE | INTRAMUSCULAR | Status: DC | PRN

## 2020-08-19 MED ORDER — NITROGLYCERIN 2 % TD OINT
0.5000 [in_us] | TOPICAL_OINTMENT | TRANSDERMAL | Status: AC
  Administered 2020-08-19: 0.5 [in_us] via TOPICAL

## 2020-08-19 NOTE — ED Provider Notes (Signed)
[facility] Emergency Department Provider Note   ____________________________________________   Event Date/Time   First MD Initiated Contact with Patient 08/19/20 1824     (approximate)  I have reviewed the triage vital signs and the nursing notes.   HISTORY  Chief Complaint Shortness of Breath and Chest Pain    HPI A 65 year old patient with a history of treated diabetes, hypertension, hypercholesterolemia and obesity presents for evaluation of chest pain. Initial onset of pain was less than one hour ago. The patient's chest pain is described as heaviness/pressure/tightness and is worse with exertion. The patient's chest pain is middle- or left-sided, is not well-localized, is not sharp and does radiate to the arms/jaw/neck. The patient does not complain of nausea and denies diaphoresis. The patient has no history of stroke, has no history of peripheral artery disease, has not smoked in the past 90 days and has no relevant family history of coronary artery disease (first degree relative at less than age 1).   Past Medical History:  Diagnosis Date  . CAD (coronary artery disease)   . Diabetes mellitus without complication ([place])   . GERD (gastroesophageal reflux disease)   . Hypertension   . Renal disorder   (pasted from [facility] note) . Anemia  . Benign essential hypertension  . Body mass index 30+ - obesity  . Chronic kidney disease stage 4 ([place])  . Chronic obstructive pulmonary disease ([place])  . Coronary arteriosclerosis  . Cyst of kidney  . Degeneration of cervical intervertebral disc  . Degenerative joint disease involving multiple joints  . Diverticular disease  . Fibrocystic disease of breast  . Fibromyalgia  . Gastroesophageal reflux disease with hiatal hernia  . History of placement of stent for coronary artery disease  . Hyperkalemia  . Hyperlipidemia  . Hypertensive disorder  . Obstructive sleep apnea syndrome  . Multinodular goiter  .  Polyneuropathy  . Diabetes mellitus ([place])  . Vitamin D deficiency  . Secondary hyperparathyroidism of renal origin [facility])    Past Surgical History Past Surgical History:  Procedure Laterality Date  . OTHER SURGICAL HISTORY  Cyst Removed;;Between Breast  . OTHER SURGICAL HISTORY  Hysterectomy; Total;;  . OTHER SURGICAL HISTORY  Laminectomy;;  . OTHER SURGICAL HISTORY  Neck Mass;Left;removed, Neck     Patient Active Problem List   Diagnosis Date Noted  . Chest pain 08/19/2020    Past Surgical History:  Procedure Laterality Date  . ABDOMINAL HYSTERECTOMY    . BACK SURGERY    . CARPAL TUNNEL RELEASE    . CORONARY ANGIOPLASTY WITH STENT PLACEMENT    . REVISION OF SCAR TISSUE RECTUS MUSCLE    . TRIGGER FINGER RELEASE      Prior to Admission medications   Medication Sig Start Date End Date Taking? Authorizing Provider  acetaminophen (TYLENOL) 325 MG tablet Take 325-650 mg by mouth every 6 (six) hours as needed for pain.   Yes [provider]  albuterol (VENTOLIN HFA) 108 (90 Base) MCG/ACT inhaler Inhale 2 puffs into the lungs every 4 (four) hours as needed for shortness of breath or wheezing.   Yes [provider]  aspirin 81 MG EC tablet Take 81 mg by mouth daily.   Yes [provider]  chlorthalidone (HYGROTON) 25 MG tablet Take 25 mg by mouth daily.   Yes [provider]  Cholecalciferol 50 MCG (2000 UT) CAPS Take 2,000 Units by mouth daily.   Yes [provider]  cloNIDine (CATAPRES) 0.1 MG tablet Take  0.1 mg by mouth daily.   Yes [provider]  clopidogrel (PLAVIX) 75 MG tablet Take 75 mg by mouth daily.   Yes [provider]  cyclobenzaprine (FLEXERIL) 5 MG tablet Take 5 mg by mouth 3 (three) times daily as needed for muscle spasms.   Yes [provider]  diltiazem (TIAZAC) 240 MG 24 hr capsule Take 240 mg by mouth daily.   Yes [provider]  famotidine (PEPCID) 20 MG tablet Take 20 mg by  mouth at bedtime.   Yes [provider]  ferrous sulfate 325 (65 FE) MG tablet Take 325 mg by mouth daily with breakfast.   Yes [provider]  furosemide (LASIX) 40 MG tablet Take 40 mg by mouth 2 (two) times daily.   Yes [provider]  insulin degludec (TRESIBA FLEXTOUCH) 200 UNIT/ML FlexTouch Pen Inject 56 Units into the skin at bedtime.   Yes [provider]  insulin lispro (HUMALOG) 100 UNIT/ML KwikPen Inject 20 Units into the skin 3 (three) times daily with meals. (according to sliding scale)   Yes [provider]  isosorbide mononitrate (IMDUR) 60 MG 24 hr tablet Take 60 mg by mouth daily.   Yes [provider]  levothyroxine (SYNTHROID) 75 MCG tablet Take 75 mcg by mouth daily.   Yes [provider]  lisinopril (ZESTRIL) 20 MG tablet Take 20 mg by mouth daily.   Yes [provider]  nitroGLYCERIN (NITROSTAT) 0.4 MG SL tablet Place 0.4 mg under the tongue every 5 (five) minutes x 3 doses as needed for chest pain.   Yes [provider]  pantoprazole (PROTONIX) 40 MG tablet Take 40 mg by mouth daily.   Yes [provider]  rosuvastatin (CRESTOR) 10 MG tablet Take 10 mg by mouth at bedtime.   Yes [provider]  senna (SENOKOT) 8.6 MG tablet Take 8.6 mg by mouth every morning.   Yes [provider]  sodium bicarbonate 650 MG tablet Take 650 mg by mouth daily.   Yes [provider]  sucralfate (CARAFATE) 1 g tablet Take 1 g by mouth 4 (four) times daily -  before meals and at bedtime.   Yes [provider]    Allergies Atorvastatin, Amlodipine, Shrimp extract allergy skin test, and Sulfa antibiotics  No family history on file.  Social History Social History   Tobacco Use  . Smoking status: Former Research scientist (life sciences)  . Smokeless tobacco: Never Used  Substance Use Topics  . Alcohol use: Not Currently  . Drug use: Not Currently    Review of Systems Constitutional: No  fever/chills Eyes: No visual changes. Cardiovascular: See HPI Respiratory:  See HPI gastrointestinal: No abdominal pain.   Genitourinary: Negative for dysuria. Musculoskeletal: Negative for back pain. Skin: Negative for rash. Neurological: Negative for headaches, areas of focal weakness or numbness.    ____________________________________________   PHYSICAL EXAM:  VITAL SIGNS: ED Triage Vitals [08/19/20 1825]  Enc Vitals Group     BP      Pulse      Resp      Temp      Temp src      SpO2      Weight 260 lb (117.9 kg)     Height '5\' 9"'$  (1.753 m)     Head Circumference      Peak Flow      Pain Score 6     Pain Loc      Pain Edu?  Excl. in [place]?     Constitutional: Alert and oriented. Well appearing and in no acute distress. Eyes: Conjunctivae are normal. Head: Atraumatic. Nose: No congestion/rhinnorhea. Mouth/Throat: Mucous membranes are moist. Neck: No stridor.  Cardiovascular: Normal rate, regular rhythm. Grossly normal heart sounds.  Good peripheral circulation. Respiratory: Normal respiratory effort.  No retractions. Lungs CTAB. Gastrointestinal: Soft and nontender. No distention. Musculoskeletal: No lower extremity tenderness nor edema. Neurologic:  Normal speech and language. No gross focal neurologic deficits are appreciated.  Skin:  Skin is warm, dry and intact. No rash noted. Psychiatric: Mood and affect are normal. Speech and behavior are normal.  ____________________________________________   LABS (all labs ordered are listed, but only abnormal results are displayed)  Labs Reviewed  CBC WITH DIFFERENTIAL/PLATELET - Abnormal; Notable for the following components:      Result Value   RBC 3.52 (*)    Hemoglobin 10.5 (*)    HCT 31.5 (*)    All other components within normal limits  COMPREHENSIVE METABOLIC PANEL - Abnormal; Notable for the following components:   Glucose, Bld 284 (*)    BUN 60 (*)    Creatinine, Ser 2.71 (*)    Total Protein 8.2  (*)    GFR, Estimated 19 (*)    All other components within normal limits  SARS CORONAVIRUS 2 (TAT 6-24 HRS)  PROTIME-INR  BRAIN NATRIURETIC PEPTIDE  HIV ANTIBODY (ROUTINE TESTING W REFLEX)  TROPONIN I (HIGH SENSITIVITY)   ____________________________________________  EKG  EKG is reviewed inter by me at 1839 heart rate 99 QRS 99 QTc 440 Normal sinus rhythm, Very minimal if any nonspecific to abnormality noted more in the inferior leads.  No STEMI. ____________________________________________  RADIOLOGY  DG Chest 2 View  Result Date: 08/19/2020 CLINICAL DATA:  Chest pain EXAM: CHEST - 2 VIEW COMPARISON:  10/10/2010 FINDINGS: The heart size and mediastinal contours are within normal limits. Both lungs are clear. The visualized skeletal structures are unremarkable. IMPRESSION: No active cardiopulmonary disease. Electronically Signed   By: Constance Holster M.D.   On: 08/19/2020 19:43    Chest x-ray reviewed negative for acute ____________________________________________   PROCEDURES  Procedure(s) performed: None  Procedures  Critical Care performed: No  ____________________________________________   INITIAL IMPRESSION / ASSESSMENT AND PLAN / ED COURSE  Pertinent labs & imaging results that were available during my care of the patient were reviewed by me and considered in my medical decision making (see chart for details).   Differential diagnosis includes, but is not limited to, ACS, aortic dissection, pulmonary embolism, cardiac tamponade, pneumothorax, pneumonia, pericarditis, myocarditis, GI-related causes including esophagitis/gastritis, and musculoskeletal chest wall pain.        Patient's labs from widened seem to be indicative of a baseline creatinine around 2.7.  ----------------------------------------- 8:52 PM on 08/19/2020 -----------------------------------------  Work-up reassuring to this point.  Patient chest pain improving.  Discussed with the  patient and considered overnight observation versus close outpatient follow-up, patient would prefer to be admitted to the hospital for further work-up.  I think this is reasonable.  She does seem to have some chest pain that is concerning for angina.  Thus far negative troponin.  Will admit for further care and management due to moderate risk factors and concerning presentation  Patient understand agreeable with this plan.   ____________________________________________   FINAL CLINICAL IMPRESSION(S) / ED DIAGNOSES  Final diagnoses:  Moderate coronary artery risk chest pain        Note:  This document was prepared using Dragon  voice recognition software and may include unintentional dictation errors       Delman Kitten, MD 08/19/20 2055

## 2020-08-19 NOTE — ED Notes (Signed)
Dr. Sidney Ace notified patient need of COVID test order. COVID swab performed and sent to lab.

## 2020-08-19 NOTE — ED Triage Notes (Signed)
Pt to ED via POV with c/o intermittent CP and SOB, pt states worse with exertion, pt states pain in her chest 6/10 at this time. Pt states when she has the pain she feels light headed, feels like her heart pounds and she has a fullness, pt states pain goes away with rest. Pt states symptoms are intermittent and have been ongoing for a while.

## 2020-08-19 NOTE — H&P (Signed)
[place]   PATIENT NAME: Amanda Montgomery    MR#:  [id number]  DATE OF BIRTH:  12/30/1955  DATE OF ADMISSION:  08/19/2020  PRIMARY CARE PHYSICIAN: Patient, No Pcp Per   Patient is coming from: Home REQUESTING/REFERRING PHYSICIAN: Delman Kitten, MD  CHIEF COMPLAINT:   Chief Complaint  Patient presents with  . Shortness of Breath  . Chest Pain    HISTORY OF PRESENT ILLNESS:  Amanda Montgomery is a 65 y.o. female with medical history significant for coronary disease status post PCI and LAD stent in 2013 and LCA stenting 2015 at [place] with in-stent LCA restenosis in 2017 as well as OM a ostial stenosis, hypertension, type diabetes mellitus, dyslipidemia, obstructive sleep apnea, stage IV chronic kidney disease, who presented to the emergency room with acute onset of midsternal chest pain which has been mainly exertional and recurring lately.  It happened today when she was at [facility] and just got out of her car walking 15 feet at which time she graded at 7/10 in severity.  She had to go back to her car and sit down before she felt better.  She stated with associated with dizziness and lightheadedness and felt this tightness and pressure.  She denied any nausea or vomiting or diaphoresis or radiation of her pain.  No recent cough or wheezing or hemoptysis or dyspnea.  She denied any associated palpitations.  No recent travels or surgeries or worsening leg swelling.  She denied any headache or paresthesias or focal muscle weakness.  No bleeding diathesis. ED Course: When she came to the ER blood pressure was 234/95 with respiratory to 22 and temperature 99.  Later blood pressure was 146/73.  Labs revealed glucose of 284, BUN of 60 and creatinine of 2.71 with baseline creatinine in the mid to upper 2 range.  CBC showed anemia with hemoglobin of 10.5 and hematocrit 31.5.  High-sensitivity troponin I was 10 BNP 43.3. EKG as reviewed by me :   Showed sinus rhythm with rate of 92 with  poor R wave progression, borderline left axis deviation Imaging: Two-view chest x-ray showed no acute cardiopulmonary disease.  The patient was given 40 aspirin-inch Nitropaste.  She will be admitted to an observation progressive unit bed for further evaluation and management. PAST MEDICAL HISTORY:   Past Medical History:  Diagnosis Date  . CAD (coronary artery disease)   . Diabetes mellitus without complication ([place])   . GERD (gastroesophageal reflux disease)   . Hypertension   . Renal disorder     PAST SURGICAL HISTORY:   Past Surgical History:  Procedure Laterality Date  . ABDOMINAL HYSTERECTOMY    . BACK SURGERY    . CARPAL TUNNEL RELEASE    . CORONARY ANGIOPLASTY WITH STENT PLACEMENT    . REVISION OF SCAR TISSUE RECTUS MUSCLE    . TRIGGER FINGER RELEASE      SOCIAL HISTORY:   Social History   Tobacco Use  . Smoking status: Former Research scientist (life sciences)  . Smokeless tobacco: Never Used  Substance Use Topics  . Alcohol use: Not Currently    FAMILY HISTORY:  No family history on file.  DRUG ALLERGIES:   Allergies  Allergen Reactions  . Atorvastatin Other (See Comments)    Leg cramps Leg cramps   . Amlodipine   . Shrimp Extract Allergy Skin Test     Makes lips tingle Makes lips tingle   . Sulfa Antibiotics     REVIEW OF SYSTEMS:  ROS As per history of present illness. All pertinent systems were reviewed above. Constitutional, HEENT, cardiovascular, respiratory, GI, GU, musculoskeletal, neuro, psychiatric, endocrine, integumentary and hematologic systems were reviewed and are otherwise negative/unremarkable except for positive findings mentioned above in the HPI.   MEDICATIONS AT HOME:   Prior to Admission medications   Not on File      VITAL SIGNS:  Blood pressure (!) 146/73, pulse 95, temperature 99 F (37.2 C), temperature source Oral, resp. rate (!) 22, height '5\' 9"'$  (1.753 m), weight 117.9 kg, SpO2 100 %.  PHYSICAL EXAMINATION:  Physical  Exam  GENERAL:  65 y.o.-year-old female patient lying in the bed with no acute distress.  EYES: Pupils equal, round, reactive to light and accommodation. No scleral icterus. Extraocular muscles intact.  HEENT: Head atraumatic, normocephalic. Oropharynx and nasopharynx clear.  NECK:  Supple, no jugular venous distention. No thyroid enlargement, no tenderness.  LUNGS: Normal breath sounds bilaterally, no wheezing, rales,rhonchi or crepitation. No use of accessory muscles of respiration.  CARDIOVASCULAR: Regular rate and rhythm, S1, S2 normal. No murmurs, rubs, or gallops.  ABDOMEN: Soft, nondistended, nontender. Bowel sounds present. No organomegaly or mass.  EXTREMITIES: No pedal edema, cyanosis, or clubbing.  NEUROLOGIC: Cranial nerves II through XII are intact. Muscle strength 5/5 in all extremities. Sensation intact. Gait not checked.  PSYCHIATRIC: The patient is alert and oriented x 3.  Normal affect and good eye contact. SKIN: No obvious rash, lesion, or ulcer.   LABORATORY PANEL:   CBC Recent Labs  Lab 08/19/20 1830  WBC 7.3  HGB 10.5*  HCT 31.5*  PLT 215   ------------------------------------------------------------------------------------------------------------------  Chemistries  Recent Labs  Lab 08/19/20 1830  NA 136  K 4.7  CL 103  CO2 23  GLUCOSE 284*  BUN 60*  CREATININE 2.71*  CALCIUM 9.0  AST 17  ALT 14  ALKPHOS 67  BILITOT 0.8   ------------------------------------------------------------------------------------------------------------------  Cardiac Enzymes No results for input(s): TROPONINI in the last 168 hours. ------------------------------------------------------------------------------------------------------------------  RADIOLOGY:  DG Chest 2 View  Result Date: 08/19/2020 CLINICAL DATA:  Chest pain EXAM: CHEST - 2 VIEW COMPARISON:  10/10/2010 FINDINGS: The heart size and mediastinal contours are within normal limits. Both lungs are clear.  The visualized skeletal structures are unremarkable. IMPRESSION: No active cardiopulmonary disease. Electronically Signed   By: Constance Holster M.D.   On: 08/19/2020 19:43      IMPRESSION AND PLAN:  Active Problems:   Chest pain  1.  Chest pain, rule out acute coronary syndrome, with history of coronary artery disease status post PCI and stent twice. -The patient will be admitted to a progressive unit observation bed. -We will follow serial troponin I's. -She will be continued on aspirin, Plavix as well as statin therapy, ACE inhibitor therapy and Imdur. -We will add beta-blocker therapy. -Should be placed on as needed sublingual nitroglycerin and IV morphine sulfate for pain. -Cardiology consult to be obtained. -I notified Dr. Clayborn Bigness about the patient.  2.  Hypertensive urgency. -This could be related to her chest pain. -We will continue her lisinopril, Cardizem CD and Nitropaste as well as place her on as needed IV labetalol. -Her clonidine will be made every 8 hours to avoid rebound phenomenon.  3.  Uncontrolled type 2 diabetes mellitus with hyperglycemia, with peripheral neuropathy and stage IV chronic kidney disease.. -The patient will be placed on supplement coverage with NovoLog I will continue her basal coverage while cutting it down into half while she is n.p.o. -We will continue  her Neurontin and ACE inhibitor therapy with her creatinine being close to baseline while hydrating her.  3.  Dyslipidemia. -We will continue statin therapy and check fasting lipids in a.m.  4.  GERD. -We will continue PPI therapy as well as H2 blocker therapy.  5.  Hypothyroidism. -We will continue Synthroid and check TSH level.  DVT prophylaxis: Lovenox. Code Status: full code.   Family Communication:  The plan of care was discussed in details with the patient (and family). I answered all questions. The patient agreed to proceed with the above mentioned plan. Further management will  depend upon hospital course. Disposition Plan: Back to previous home environment Consults called: Cardiology consult to Dr. Towanda Malkin with [facility]. All the records are reviewed and case discussed with ED provider.  Status is: Observation  The patient remains OBS appropriate and will d/c before 2 midnights.  Dispo: The patient is from: Home              Anticipated d/c is to: Home              Patient currently is not medically stable to d/c.   Difficult to place patient No   TOTAL TIME TAKING CARE OF THIS PATIENT: 60 minutes.    Christel Mormon M.D on 08/19/2020 at 7:55 PM  [facility]   From 7 PM-7 AM, contact night-coverage [email or web address]  CC: Primary care physician; Patient, No Pcp Per

## 2020-08-19 NOTE — ED Notes (Signed)
Pharmacy tech at bedside 

## 2020-08-20 DIAGNOSIS — R079 Chest pain, unspecified: Secondary | ICD-10-CM

## 2020-08-20 DIAGNOSIS — N184 Chronic kidney disease, stage 4 (severe): Secondary | ICD-10-CM

## 2020-08-20 DIAGNOSIS — E1122 Type 2 diabetes mellitus with diabetic chronic kidney disease: Secondary | ICD-10-CM

## 2020-08-20 LAB — HEMOGLOBIN A1C
Hgb A1c MFr Bld: 8.7 % — ABNORMAL HIGH (ref 4.8–5.6)
Mean Plasma Glucose: 202.99 mg/dL

## 2020-08-20 LAB — TROPONIN I (HIGH SENSITIVITY)
Troponin I (High Sensitivity): 17 ng/L (ref <18)
Troponin I (High Sensitivity): 14 ng/L (ref <18)

## 2020-08-20 LAB — SARS CORONAVIRUS 2 (TAT 6-24 HRS): SARS Coronavirus 2: NEGATIVE

## 2020-08-20 LAB — HIV ANTIBODY (ROUTINE TESTING W REFLEX): HIV Screen 4th Generation wRfx: NONREACTIVE

## 2020-08-20 LAB — GLUCOSE, CAPILLARY: Glucose-Capillary: 201 mg/dL — ABNORMAL HIGH (ref 70–99)

## 2020-08-20 MED ORDER — CLONIDINE HCL 0.1 MG PO TABS: 0.3000 mg | ORAL_TABLET | Freq: Two times a day (BID) | ORAL | 0 refills | Status: DC

## 2020-08-20 NOTE — Plan of Care (Signed)
  Problem: Education: Goal: Knowledge of General Education information will improve Description: Including pain rating scale, medication(s)/side effects and non-pharmacologic comfort measures Outcome: Progressing   Problem: Clinical Measurements: Goal: Cardiovascular complication will be avoided Outcome: Progressing   Problem: Activity: Goal: Risk for activity intolerance will decrease Outcome: Progressing   Problem: Nutrition: Goal: Adequate nutrition will be maintained Outcome: Progressing   Problem: Pain Managment: Goal: General experience of comfort will improve Outcome: Progressing

## 2020-08-20 NOTE — Consult Note (Signed)
[facility] Consultation Note  Patient ID: KATRIELLE MONTEROSA, MRN: [id number], DOB/AGE: Mar 13, 1956 65 y.o. Admit date: 08/19/2020   Date of Consult: 08/20/2020 Primary Physician: Patient, No Pcp Per Primary Cardiologist: [facility]  Chief Complaint:  Chief Complaint  Patient presents with  . Shortness of Breath  . Chest Pain   Reason for Consult: Chest pain shortness of breath palpitations  HPI: 65 y.o. female with known cardiovascular disease status post PCI and stent placement of left anterior descending artery in 2013.  Patient has hypertension hyperlipidemia and on appropriate medication management for further risk reduction.  She has had medical management of anginal symptoms but claims that on the last several weeks she has had progression of shortness of breath and pounding in her chest.  He has pounding in her chest may multifactorial in nature including slower heart rate and/or other concerns.  It takes longer for her to recover from of these symptoms.  The patient does have an EKG showing normal sinus rhythm with nonspecific ST changes but unchanged from before and no current evidence of myocardial infarction.  Troponin is 22/17/14 without evidence of acute coronary syndrome.  Currently she feels well with no further significant symptoms at this time.  She also has had bradycardia which is likely multifactorial in nature including the combination of diltiazem clonidine and metoprolol.  Therefore will need to adjust medication management.  Past Medical History:  Diagnosis Date  . CAD (coronary artery disease)   . Diabetes mellitus without complication ([place])   . GERD (gastroesophageal reflux disease)   . Hypertension   . Renal disorder       Surgical History:  Past Surgical History:  Procedure Laterality Date  . ABDOMINAL HYSTERECTOMY    . BACK SURGERY    . CARPAL TUNNEL RELEASE    . CORONARY ANGIOPLASTY WITH STENT PLACEMENT    . REVISION OF SCAR  TISSUE RECTUS MUSCLE    . TRIGGER FINGER RELEASE       Home Meds: Prior to Admission medications   Medication Sig Start Date End Date Taking? Authorizing Provider  acetaminophen (TYLENOL) 325 MG tablet Take 325-650 mg by mouth every 6 (six) hours as needed for pain.   Yes [provider]  albuterol (VENTOLIN HFA) 108 (90 Base) MCG/ACT inhaler Inhale 2 puffs into the lungs every 4 (four) hours as needed for shortness of breath or wheezing.   Yes [provider]  aspirin 81 MG EC tablet Take 81 mg by mouth daily.   Yes [provider]  chlorthalidone (HYGROTON) 25 MG tablet Take 25 mg by mouth daily.   Yes [provider]  Cholecalciferol 50 MCG (2000 UT) CAPS Take 2,000 Units by mouth daily.   Yes [provider]  cloNIDine (CATAPRES) 0.1 MG tablet Take 0.1 mg by mouth daily.   Yes [provider]  clopidogrel (PLAVIX) 75 MG tablet Take 75 mg by mouth daily.   Yes [provider]  cyclobenzaprine (FLEXERIL) 5 MG tablet Take 5 mg by mouth 3 (three) times daily as needed for muscle spasms.   Yes [provider]  diltiazem (TIAZAC) 240 MG 24 hr capsule Take 240 mg by mouth daily.   Yes [provider]  famotidine (PEPCID) 20 MG tablet Take 20 mg by mouth at bedtime.   Yes [provider]  ferrous sulfate 325 (65 FE) MG tablet Take 325 mg by mouth daily with breakfast.   Yes [provider]  furosemide (LASIX)  40 MG tablet Take 40 mg by mouth 2 (two) times daily.   Yes [provider]  insulin degludec (TRESIBA FLEXTOUCH) 200 UNIT/ML FlexTouch Pen Inject 56 Units into the skin at bedtime.   Yes [provider]  insulin lispro (HUMALOG) 100 UNIT/ML KwikPen Inject 20 Units into the skin 3 (three) times daily with meals. (according to sliding scale)   Yes [provider]  isosorbide mononitrate (IMDUR) 60 MG 24 hr tablet Take 60 mg by mouth daily.   Yes [provider]   levothyroxine (SYNTHROID) 75 MCG tablet Take 75 mcg by mouth daily.   Yes [provider]  lisinopril (ZESTRIL) 20 MG tablet Take 20 mg by mouth daily.   Yes [provider]  nitroGLYCERIN (NITROSTAT) 0.4 MG SL tablet Place 0.4 mg under the tongue every 5 (five) minutes x 3 doses as needed for chest pain.   Yes [provider]  pantoprazole (PROTONIX) 40 MG tablet Take 40 mg by mouth daily.   Yes [provider]  rosuvastatin (CRESTOR) 10 MG tablet Take 10 mg by mouth at bedtime.   Yes [provider]  senna (SENOKOT) 8.6 MG tablet Take 8.6 mg by mouth every morning.   Yes [provider]  sodium bicarbonate 650 MG tablet Take 650 mg by mouth daily.   Yes [provider]  sucralfate (CARAFATE) 1 g tablet Take 1 g by mouth 4 (four) times daily -  before meals and at bedtime.   Yes [provider]    Inpatient Medications:  . aspirin EC  81 mg Oral Daily  . cholecalciferol  2,000 Units Oral Daily  . cloNIDine  0.1 mg Oral Q8H  . clopidogrel  75 mg Oral Daily  . diltiazem  240 mg Oral Daily  . enoxaparin (LOVENOX) injection  40 mg Subcutaneous Q24H  . famotidine  20 mg Oral QHS  . ferrous sulfate  325 mg Oral Q breakfast  . furosemide  40 mg Oral BID  . insulin aspart  0-9 Units Subcutaneous TID WC  . insulin glargine  28 Units Subcutaneous QHS  . isosorbide mononitrate  60 mg Oral Daily  . levothyroxine  75 mcg Oral QAC breakfast  . lisinopril  20 mg Oral Daily  . metoprolol tartrate  12.5 mg Oral BID  . pantoprazole  40 mg Oral Daily  . rosuvastatin  10 mg Oral QHS  . senna  8.6 mg Oral q morning  . sodium bicarbonate  650 mg Oral Daily  . sucralfate  1 g Oral TID AC & HS     Allergies:  Allergies  Allergen Reactions  . Atorvastatin Other (See Comments)    Leg cramps Leg cramps   . Amlodipine   . Shrimp Extract Allergy Skin Test     Makes lips tingle Makes lips tingle   . Sulfa Antibiotics      Social History   Socioeconomic History  . Marital status: Single    Spouse name: Not on file  . Number of children: Not on file  . Years of education: Not on file  . Highest education level: Not on file  Occupational History  . Not on file  Tobacco Use  . Smoking status: Former Research scientist (life sciences)  . Smokeless tobacco: Never Used  Substance and Sexual Activity  . Alcohol use: Not Currently  . Drug use: Not Currently  . Sexual activity: Not on file  Other Topics Concern  . Not on file  Social History Narrative  .  Not on file   Social Determinants of Health   Financial Resource Strain: Not on file  Food Insecurity: Not on file  Transportation Needs: Not on file  Physical Activity: Not on file  Stress: Not on file  Social Connections: Not on file  Intimate Partner Violence: Not on file     No family history on file.   Review of Systems Positive for shortness of breath pounding chest pain Negative for: General:  chills, fever, night sweats or weight changes.  Cardiovascular: PND orthopnea syncope dizziness  Dermatological skin lesions rashes Respiratory: Cough congestion Urologic: Frequent urination urination at night and hematuria Abdominal: negative for nausea, vomiting, diarrhea, bright red blood per rectum, melena, or hematemesis Neurologic: negative for visual changes, and/or hearing changes  All other systems reviewed and are otherwise negative except as noted above.  Labs: No results for input(s): CKTOTAL, CKMB, TROPONINI in the last 72 hours. Lab Results  Component Value Date   WBC 7.3 08/19/2020   HGB 10.5 (L) 08/19/2020   HCT 31.5 (L) 08/19/2020   MCV 89.5 08/19/2020   PLT 215 08/19/2020    Recent Labs  Lab 08/19/20 1830  NA 136  K 4.7  CL 103  CO2 23  BUN 60*  CREATININE 2.71*  CALCIUM 9.0  PROT 8.2*  BILITOT 0.8  ALKPHOS 67  ALT 14  AST 17  GLUCOSE 284*   No results found for: CHOL, HDL, LDLCALC, TRIG No results found for:  DDIMER  Radiology/Studies:  DG Chest 2 View  Result Date: 08/19/2020 CLINICAL DATA:  Chest pain EXAM: CHEST - 2 VIEW COMPARISON:  10/10/2010 FINDINGS: The heart size and mediastinal contours are within normal limits. Both lungs are clear. The visualized skeletal structures are unremarkable. IMPRESSION: No active cardiopulmonary disease. Electronically Signed   By: Constance Holster M.D.   On: 08/19/2020 19:43    EKG: Normal sinus rhythm with poor R wave progression and no current evidence of myocardial infarction  Weights: Filed Weights   08/19/20 1825  Weight: 117.9 kg     Physical Exam: Blood pressure 130/64, pulse (!) 54, temperature 98.1 F (36.7 C), temperature source Axillary, resp. rate 17, height '5\' 9"'$  (1.753 m), weight 117.9 kg, SpO2 100 %. Body mass index is 38.4 kg/m. General: Well developed, well nourished, in no acute distress. Head eyes ears nose throat: Normocephalic, atraumatic, sclera non-icteric, no xanthomas, nares are without discharge. No apparent thyromegaly and/or mass  Lungs: Normal respiratory effort.  no wheezes, no rales, few rhonchi.  Heart: RRR with normal S1 S2. no murmur gallop, no rub, PMI is normal size and placement, carotid upstroke normal without bruit, jugular venous pressure is normal Abdomen: Soft, non-tender,  distended with normoactive bowel sounds. No hepatomegaly. No rebound/guarding. No obvious abdominal masses. Abdominal aorta is normal size without bruit Extremities: Trace edema. no cyanosis, no clubbing, no ulcers  Peripheral : 2+ bilateral upper extremity pulses, 2+ bilateral femoral pulses, 2+ bilateral dorsal pedal pulse Neuro: Alert and oriented. No facial asymmetry. No focal deficit. Moves all extremities spontaneously. Musculoskeletal: Normal muscle tone without kyphosis Psych:  Responds to questions appropriately with a normal affect.    Assessment: 65 year old female with diabetes I am hypertension hyperlipidemia coronary  disease status post PCI and stent placement remote past with atypical pounding in her chest without evidence of myocardial infarction acute coronary syndrome and/or congestive heart failure Plan 1.  Continuation of isosorbide for anginal symptoms and pounding in her chest with coronary artery disease 2.  Discontinuation  of diltiazem due to bradycardia and concerns of low heart rate but to replace with amlodipine 10 mg each day for blood pressure 3.  Continuation of low-dose metoprolol at 25 mg twice per day for antianginals watching closely for bradycardia.  We will continue clonidine for hypertension control despite knowing that this may cause some bradycardia as well 4.  No further cardiac diagnostics necessary at this time due to no evidence of myocardial infarction or acute coronary syndrome 5.  Continuation of dual antiplatelet therapy at this time for further risk reduction cardiovascular event but will have adjustments as an outpatient as per her cardiologist and with follow-up 6.  Patient ambulating well with no further significant symptoms the patient may be discharged home from a cardiac standpoint with follow-up with cardiologist this week Signed, Corey Skains M.D. [facility] 08/20/2020, 1:45 PM

## 2020-08-20 NOTE — Progress Notes (Addendum)
Pt was NPO midnight but has meicine due at 0600. NP Randol Kern made aware. Will continue to monitor.  Update 0547: Heart/carb modified NP Randol Kern. Will continue to monitor.

## 2020-08-20 NOTE — Discharge Summary (Signed)
Physician Discharge Summary  Patient ID: Amanda Montgomery MRN: [id number] DOB/AGE: 12/02/1955 65 y.o.  Admit date: 08/19/2020 Discharge date: 08/20/2020  Admission Diagnoses:  Discharge Diagnoses:  Active Problems:   Chest pain   Discharged Condition: good  Hospital Course:  JERSEY GARBUTT is a 65 y.o. female with medical history significant for coronary disease status post PCI and LAD stent in 2013 and LCA stenting 2015 at [facility] with in-stent LCA restenosis in 2017 as well as OM a ostial stenosis, hypertension, type diabetes mellitus, dyslipidemia, obstructive sleep apnea, stage IV chronic kidney disease, who presented to the emergency room with acute onset of midsternal chest pain which has been mainly exertional and recurring lately.  Troponin peaked at 22, BNP 43.  She was evaluated by cardiology, deemed not to need any at this time.  Resumed all home medicines.  He will be followed by cardiology as outpatient.  #1.  Chest pain with stable angina. Sinus bradycardia. Hypertension urgency. Patient condition has improved.  Patient has some bradycardia, discontinue Cardizem per cardiology recommendation.  Patient also had allergy to Norvasc.  I have increased patient clonidine to 0.3 mg twice a day for better blood pressure control.  #2.  Type 2 diabetes uncontrolled with hyperglycemia and peripheral neuropathy and stage IV chronic kidney disease. Follow-up with PCP to adjust medications.  3.  Hypothyroidism.   Consults: cardiology  Significant Diagnostic Studies:  CHEST - 2 VIEW  COMPARISON:  10/10/2010  FINDINGS: The heart size and mediastinal contours are within normal limits. Both lungs are clear. The visualized skeletal structures are unremarkable.  IMPRESSION: No active cardiopulmonary disease.   Electronically Signed   By: Constance Holster M.D.   On: 08/19/2020 19:43  Treatments: Heart monitor  Discharge Exam: Blood pressure 130/64, pulse  (!) 54, temperature 98.1 F (36.7 C), temperature source Axillary, resp. rate 17, height '5\' 9"'$  (1.753 m), weight 117.9 kg, SpO2 100 %. General appearance: alert and cooperative Resp: clear to auscultation bilaterally Cardio: regular rate and rhythm, S1, S2 normal, no murmur, click, rub or gallop GI: soft, non-tender; bowel sounds normal; no masses,  no organomegaly Extremities: extremities normal, atraumatic, no cyanosis or edema  Disposition: Discharge disposition: 01-Home or Self Care       Discharge Instructions    Diet - low sodium heart healthy   Complete by: As directed    Increase activity slowly   Complete by: As directed      Allergies as of 08/20/2020      Reactions   Atorvastatin Other (See Comments)   Leg cramps Leg cramps   Amlodipine    Shrimp Extract Allergy Skin Test    Makes lips tingle Makes lips tingle   Sulfa Antibiotics       Medication List    STOP taking these medications   diltiazem 240 MG 24 hr capsule Commonly known as: TIAZAC     TAKE these medications   acetaminophen 325 MG tablet Commonly known as: TYLENOL Take 325-650 mg by mouth every 6 (six) hours as needed for pain.   albuterol 108 (90 Base) MCG/ACT inhaler Commonly known as: VENTOLIN HFA Inhale 2 puffs into the lungs every 4 (four) hours as needed for shortness of breath or wheezing.   aspirin 81 MG EC tablet Take 81 mg by mouth daily.   chlorthalidone 25 MG tablet Commonly known as: HYGROTON Take 25 mg by mouth daily.   Cholecalciferol 50 MCG (2000 UT) Caps Take 2,000 Units by mouth daily.  cloNIDine 0.1 MG tablet Commonly known as: CATAPRES Take 3 tablets (0.3 mg total) by mouth 2 (two) times daily. What changed:   how much to take  when to take this   clopidogrel 75 MG tablet Commonly known as: PLAVIX Take 75 mg by mouth daily.   cyclobenzaprine 5 MG tablet Commonly known as: FLEXERIL Take 5 mg by mouth 3 (three) times daily as needed for muscle spasms.    famotidine 20 MG tablet Commonly known as: PEPCID Take 20 mg by mouth at bedtime.   ferrous sulfate 325 (65 FE) MG tablet Take 325 mg by mouth daily with breakfast.   furosemide 40 MG tablet Commonly known as: LASIX Take 40 mg by mouth 2 (two) times daily.   insulin lispro 100 UNIT/ML KwikPen Commonly known as: HUMALOG Inject 20 Units into the skin 3 (three) times daily with meals. (according to sliding scale)   isosorbide mononitrate 60 MG 24 hr tablet Commonly known as: IMDUR Take 60 mg by mouth daily.   levothyroxine 75 MCG tablet Commonly known as: SYNTHROID Take 75 mcg by mouth daily.   lisinopril 20 MG tablet Commonly known as: ZESTRIL Take 20 mg by mouth daily.   nitroGLYCERIN 0.4 MG SL tablet Commonly known as: NITROSTAT Place 0.4 mg under the tongue every 5 (five) minutes x 3 doses as needed for chest pain.   pantoprazole 40 MG tablet Commonly known as: PROTONIX Take 40 mg by mouth daily.   rosuvastatin 10 MG tablet Commonly known as: CRESTOR Take 10 mg by mouth at bedtime.   senna 8.6 MG tablet Commonly known as: SENOKOT Take 8.6 mg by mouth every morning.   sodium bicarbonate 650 MG tablet Take 650 mg by mouth daily.   sucralfate 1 g tablet Commonly known as: CARAFATE Take 1 g by mouth 4 (four) times daily -  before meals and at bedtime.   Tyler Aas FlexTouch 200 UNIT/ML FlexTouch Pen Generic drug: insulin degludec Inject 56 Units into the skin at bedtime.       Follow-up Information    Corey Skains, MD Follow up in 2 week(s).   Specialty: Cardiology Contact information: [facility] [place] [address] [phone]               Signed: Sharen Hones 08/20/2020, 2:25 PM

## 2020-10-25 ENCOUNTER — Other Ambulatory Visit: Payer: Self-pay

## 2020-10-25 ENCOUNTER — Emergency Department: Payer: 59

## 2020-10-25 ENCOUNTER — Emergency Department
Admission: EM | Admit: 2020-10-25 | Discharge: 2020-10-25 | Disposition: A | Discharge status: Home / Self Care | Payer: 59 | Attending: Emergency Medicine | Admitting: Emergency Medicine

## 2020-10-25 DIAGNOSIS — Z87891 Personal history of nicotine dependence: Secondary | ICD-10-CM | POA: Insufficient documentation

## 2020-10-25 DIAGNOSIS — Z7982 Long term (current) use of aspirin: Secondary | ICD-10-CM | POA: Insufficient documentation

## 2020-10-25 DIAGNOSIS — Z955 Presence of coronary angioplasty implant and graft: Secondary | ICD-10-CM | POA: Diagnosis not present

## 2020-10-25 DIAGNOSIS — R0600 Dyspnea, unspecified: Secondary | ICD-10-CM | POA: Insufficient documentation

## 2020-10-25 DIAGNOSIS — I129 Hypertensive chronic kidney disease with stage 1 through stage 4 chronic kidney disease, or unspecified chronic kidney disease: Secondary | ICD-10-CM | POA: Diagnosis not present

## 2020-10-25 DIAGNOSIS — Z794 Long term (current) use of insulin: Secondary | ICD-10-CM | POA: Insufficient documentation

## 2020-10-25 DIAGNOSIS — R0602 Shortness of breath: Secondary | ICD-10-CM | POA: Insufficient documentation

## 2020-10-25 DIAGNOSIS — N184 Chronic kidney disease, stage 4 (severe): Secondary | ICD-10-CM | POA: Insufficient documentation

## 2020-10-25 DIAGNOSIS — I2510 Atherosclerotic heart disease of native coronary artery without angina pectoris: Secondary | ICD-10-CM | POA: Diagnosis not present

## 2020-10-25 DIAGNOSIS — Z79899 Other long term (current) drug therapy: Secondary | ICD-10-CM | POA: Insufficient documentation

## 2020-10-25 DIAGNOSIS — E1122 Type 2 diabetes mellitus with diabetic chronic kidney disease: Secondary | ICD-10-CM | POA: Insufficient documentation

## 2020-10-25 LAB — COMPREHENSIVE METABOLIC PANEL
ALT: 15 U/L (ref 0–44)
AST: 15 U/L (ref 15–41)
Albumin: 2.8 g/dL — ABNORMAL LOW (ref 3.5–5.0)
Alkaline Phosphatase: 46 U/L (ref 38–126)
Anion gap: 11 (ref 5–15)
BUN: 66 mg/dL — ABNORMAL HIGH (ref 8–23)
CO2: 27 mmol/L (ref 22–32)
Calcium: 8.3 mg/dL — ABNORMAL LOW (ref 8.9–10.3)
Chloride: 98 mmol/L (ref 98–111)
Creatinine, Ser: 3.15 mg/dL — ABNORMAL HIGH (ref 0.44–1.00)
GFR, Estimated: 16 mL/min — ABNORMAL LOW (ref >60)
Glucose, Bld: 197 mg/dL — ABNORMAL HIGH (ref 70–99)
Potassium: 4.4 mmol/L (ref 3.5–5.1)
Sodium: 136 mmol/L (ref 135–145)
Total Bilirubin: 1 mg/dL (ref 0.3–1.2)
Total Protein: 6.5 g/dL (ref 6.5–8.1)

## 2020-10-25 LAB — CBC
HCT: 24.1 % — ABNORMAL LOW (ref 36.0–46.0)
Hemoglobin: 7.8 g/dL — ABNORMAL LOW (ref 12.0–15.0)
MCH: 29.7 pg (ref 26.0–34.0)
MCHC: 32.4 g/dL (ref 30.0–36.0)
MCV: 91.6 fL (ref 80.0–100.0)
Platelets: 317 10*3/uL (ref 150–400)
RBC: 2.63 MIL/uL — ABNORMAL LOW (ref 3.87–5.11)
RDW: 13 % (ref 11.5–15.5)
WBC: 9.6 10*3/uL (ref 4.0–10.5)
nRBC: 0 % (ref 0.0–0.2)

## 2020-10-25 LAB — TROPONIN I (HIGH SENSITIVITY)
Troponin I (High Sensitivity): 55 ng/L — ABNORMAL HIGH (ref <18)
Troponin I (High Sensitivity): 52 ng/L — ABNORMAL HIGH (ref <18)

## 2020-10-25 MED ORDER — ALBUTEROL SULFATE HFA 108 (90 BASE) MCG/ACT IN AERS
2.0000 | INHALATION_SPRAY | RESPIRATORY_TRACT | Status: DC | PRN
  Administered 2020-10-25: 2 via RESPIRATORY_TRACT
  Filled 2020-10-25: qty 6.7

## 2020-10-25 NOTE — ED Provider Notes (Signed)
[facility] Emergency Department Provider Note  Time seen: 6:15 PM  I have reviewed the triage vital signs and the nursing notes.   HISTORY  Chief Complaint Shortness of Breath   HPI Amanda Montgomery is a 65 y.o. female with a past medical history of CAD, diabetes, CKD, gastric reflux, hypertension, cardiac bypass 1 week ago at [facility] in [facility], presents to the emergency department for shortness of breath.  According to the patient she was at home when she began feeling short of breath.  States she was sitting and not doing anything overly exertional.  Patient states it was difficult for her to catch her breath or talk due to shortness of breath.  Patient denies any pain in the chest besides mild central chest pain which she has had since the bypass procedure.   Patient states her lower extremity edema is improving.  Denies any shortness of breath currently.  Past Medical History:  Diagnosis Date  . CAD (coronary artery disease)   . Diabetes mellitus without complication ([place])   . GERD (gastroesophageal reflux disease)   . Hypertension   . Renal disorder     Patient Active Problem List   Diagnosis Date Noted  . Type 2 diabetes mellitus with stage 4 chronic kidney disease ([place]) 08/20/2020  . Chest pain 08/19/2020    Past Surgical History:  Procedure Laterality Date  . ABDOMINAL HYSTERECTOMY    . BACK SURGERY    . CARDIAC SURGERY    . CARPAL TUNNEL RELEASE    . CORONARY ANGIOPLASTY WITH STENT PLACEMENT    . REVISION OF SCAR TISSUE RECTUS MUSCLE    . TRIGGER FINGER RELEASE      Prior to Admission medications   Medication Sig Start Date End Date Taking? Authorizing Provider  acetaminophen (TYLENOL) 325 MG tablet Take 325-650 mg by mouth every 6 (six) hours as needed for pain.    [provider]  albuterol (VENTOLIN HFA) 108 (90 Base) MCG/ACT inhaler Inhale 2 puffs into the lungs every 4 (four) hours as needed for  shortness of breath or wheezing.    [provider]  aspirin 81 MG EC tablet Take 81 mg by mouth daily.    [provider]  chlorthalidone (HYGROTON) 25 MG tablet Take 25 mg by mouth daily.    [provider]  Cholecalciferol 50 MCG (2000 UT) CAPS Take 2,000 Units by mouth daily.    [provider]  cloNIDine (CATAPRES) 0.1 MG tablet Take 3 tablets (0.3 mg total) by mouth 2 (two) times daily. 08/20/20   Sharen Hones, MD  clopidogrel (PLAVIX) 75 MG tablet Take 75 mg by mouth daily.    [provider]  cyclobenzaprine (FLEXERIL) 5 MG tablet Take 5 mg by mouth 3 (three) times daily as needed for muscle spasms.    [provider]  famotidine (PEPCID) 20 MG tablet Take 20 mg by mouth at bedtime.    [provider]  ferrous sulfate 325 (65 FE) MG tablet Take 325 mg by mouth daily with breakfast.    [provider]  furosemide (LASIX) 40 MG tablet Take 40 mg by mouth 2 (two) times daily.    [provider]  insulin degludec (TRESIBA FLEXTOUCH) 200 UNIT/ML FlexTouch Pen Inject 56 Units into the skin at bedtime.    [provider]  insulin lispro (HUMALOG) 100 UNIT/ML KwikPen Inject 20 Units into the skin 3 (three) times daily with meals. (according to sliding scale)  [provider]  isosorbide mononitrate (IMDUR) 60 MG 24 hr tablet Take 60 mg by mouth daily.    [provider]  levothyroxine (SYNTHROID) 75 MCG tablet Take 75 mcg by mouth daily.    [provider]  lisinopril (ZESTRIL) 20 MG tablet Take 20 mg by mouth daily.    [provider]  nitroGLYCERIN (NITROSTAT) 0.4 MG SL tablet Place 0.4 mg under the tongue every 5 (five) minutes x 3 doses as needed for chest pain.    [provider]  pantoprazole (PROTONIX) 40 MG tablet Take 40 mg by mouth daily.    [provider]  rosuvastatin (CRESTOR) 10 MG tablet Take 10 mg by mouth at bedtime.    [provider]  senna (SENOKOT) 8.6 MG tablet Take 8.6 mg by mouth every morning.    [provider]  sodium bicarbonate 650 MG tablet Take 650 mg by mouth daily.    [provider]  sucralfate (CARAFATE) 1 g tablet Take 1 g by mouth 4 (four) times daily -  before meals and at bedtime.    [provider]    Allergies  Allergen Reactions  . Atorvastatin Other (See Comments)    Leg cramps Leg cramps   . Amlodipine   . Shrimp Extract Allergy Skin Test     Makes lips tingle Makes lips tingle   . Sulfa Antibiotics     History reviewed. No pertinent family history.  Social History Social History   Tobacco Use  . Smoking status: Former Research scientist (life sciences)  . Smokeless tobacco: Never Used  Substance Use Topics  . Alcohol use: Not Currently  . Drug use: Not Currently    Review of Systems Constitutional: Negative for fever. Cardiovascular: Negative for chest pain. Respiratory: Shortness of breath since today, nearly resolved. Gastrointestinal: Negative for abdominal pain, vomiting  Musculoskeletal: Mild lower extremity edema but improving per patient. Neurological: Negative for headache All other ROS negative  ____________________________________________   PHYSICAL EXAM:  VITAL SIGNS: ED Triage Vitals  Enc Vitals Group     BP 10/25/20 1743 (!) 183/87     Pulse Rate 10/25/20 1743 89     Resp 10/25/20 1743 (!) 24     Temp 10/25/20 1743 97.7 F (36.5 C)     Temp Source 10/25/20 1743 Oral     SpO2 10/25/20 1743 98 %     Weight 10/25/20 1744 264 lb (119.7 kg)     Height 10/25/20 1744 '5\' 9"'$  (1.753 m)     Head Circumference --      Peak Flow --      Pain Score 10/25/20 1755 4     Pain Loc --      Pain Edu? --      Excl. in [place]? --    Constitutional: Alert and oriented. Well appearing and in no distress. Eyes: Normal exam ENT      Head: Normocephalic and atraumatic.      Mouth/Throat: Mucous membranes are moist. Cardiovascular: Normal rate, regular  rhythm.  Respiratory: Normal respiratory effort without tachypnea nor retractions. Breath sounds are clear.  Well-appearing sternotomy incision Gastrointestinal: Soft and nontender. No distention.  Musculoskeletal: Mild lower extremity edema improving.  No calf tenderness. Neurologic:  Normal speech and language. No gross focal neurologic deficits  Skin:  Skin is warm.  Sternotomy incision is well-appearing, Steri-Strips in place. Psychiatric: Mood and affect are normal.   ____________________________________________    EKG  EKG viewed and interpreted by myself shows  a normal sinus rhythm at 72 bpm with a narrow QRS, normal axis, normal intervals, nonspecific but no concerning ST changes.  ____________________________________________    RADIOLOGY  Chest x-ray shows small bilateral effusions.  Interval sternotomy.  ____________________________________________   INITIAL IMPRESSION / ASSESSMENT AND PLAN / ED COURSE  Pertinent labs & imaging results that were available during my care of the patient were reviewed by me and considered in my medical decision making (see chart for details).   Patient presents emergency department for shortness of breath that occurred today, largely resolved.  Overall patient appears well satting 98 to 99% on room air during my evaluation.  Well-appearing sternotomy incision from recent CABG 1 week ago.  Clear lung sounds.  We will check labs including cardiac enzymes obtain a chest x-ray and continue to closely monitor.  Patient states she feels much better currently and denies shortness of breath at this time.  Patient's lab work is largely [place].  Hemoglobin 7.8, I reviewed the patient's care everywhere chart her hemoglobin is 7.98 days ago.  Troponin largely unchanged on delta, likely slightly elevated from recent CABG.  X-ray shows bilateral small effusions.  Overall patient appears well with a reassuring work-up and I believe the patient is safe  for discharge home with outpatient follow-up.  I discussed return precautions.  Patient agreeable to plan of care.  Amanda Montgomery was evaluated in Emergency Department on 10/25/2020 for the symptoms described in the history of present illness. She was evaluated in the context of the global COVID-19 pandemic, which necessitated consideration that the patient might be at risk for infection with the SARS-CoV-2 virus that causes COVID-19. Institutional protocols and algorithms that pertain to the evaluation of patients at risk for COVID-19 are in a state of rapid change based on information released by regulatory bodies including the CDC and federal and state organizations. These policies and algorithms were followed during the patient's care in the ED.  ____________________________________________   FINAL CLINICAL IMPRESSION(S) / ED DIAGNOSES  Dyspnea   Harvest Dark, MD 10/25/20 2102

## 2020-10-25 NOTE — ED Notes (Signed)
Patient assisted to family POV via wheelchair, but ambulatory to vehicle. NADN.

## 2020-10-25 NOTE — ED Notes (Signed)
Patient transported to X-ray 

## 2020-10-25 NOTE — ED Triage Notes (Signed)
Pt had quadruple bypass surgery 10/18/20. Pt with new onset SOB today. Pt denies new edema. Left leg with edema from surgery per pt. O2 sats 998% on room air, dyspnea noted with exertion and speaking

## 2021-02-07 ENCOUNTER — Emergency Department: Payer: 59

## 2021-02-07 ENCOUNTER — Emergency Department
Admission: EM | Admit: 2021-02-07 | Discharge: 2021-02-07 | Disposition: A | Discharge status: Home / Self Care | Payer: 59 | Attending: Emergency Medicine | Admitting: Emergency Medicine

## 2021-02-07 ENCOUNTER — Other Ambulatory Visit: Payer: Self-pay

## 2021-02-07 ENCOUNTER — Telehealth: Payer: Self-pay | Admitting: Emergency Medicine

## 2021-02-07 DIAGNOSIS — Z87891 Personal history of nicotine dependence: Secondary | ICD-10-CM | POA: Insufficient documentation

## 2021-02-07 DIAGNOSIS — I2510 Atherosclerotic heart disease of native coronary artery without angina pectoris: Secondary | ICD-10-CM | POA: Diagnosis not present

## 2021-02-07 DIAGNOSIS — R42 Dizziness and giddiness: Secondary | ICD-10-CM | POA: Diagnosis not present

## 2021-02-07 DIAGNOSIS — Z7982 Long term (current) use of aspirin: Secondary | ICD-10-CM | POA: Insufficient documentation

## 2021-02-07 DIAGNOSIS — E1122 Type 2 diabetes mellitus with diabetic chronic kidney disease: Secondary | ICD-10-CM | POA: Diagnosis not present

## 2021-02-07 DIAGNOSIS — Z7902 Long term (current) use of antithrombotics/antiplatelets: Secondary | ICD-10-CM | POA: Insufficient documentation

## 2021-02-07 DIAGNOSIS — N184 Chronic kidney disease, stage 4 (severe): Secondary | ICD-10-CM | POA: Insufficient documentation

## 2021-02-07 DIAGNOSIS — I129 Hypertensive chronic kidney disease with stage 1 through stage 4 chronic kidney disease, or unspecified chronic kidney disease: Secondary | ICD-10-CM | POA: Diagnosis not present

## 2021-02-07 DIAGNOSIS — Z79899 Other long term (current) drug therapy: Secondary | ICD-10-CM | POA: Diagnosis not present

## 2021-02-07 DIAGNOSIS — Z794 Long term (current) use of insulin: Secondary | ICD-10-CM | POA: Diagnosis not present

## 2021-02-07 DIAGNOSIS — N3000 Acute cystitis without hematuria: Secondary | ICD-10-CM | POA: Insufficient documentation

## 2021-02-07 LAB — CBC
HCT: 29.8 % — ABNORMAL LOW (ref 36.0–46.0)
Hemoglobin: 10.1 g/dL — ABNORMAL LOW (ref 12.0–15.0)
MCH: 29.4 pg (ref 26.0–34.0)
MCHC: 33.9 g/dL (ref 30.0–36.0)
MCV: 86.6 fL (ref 80.0–100.0)
Platelets: 185 10*3/uL (ref 150–400)
RBC: 3.44 MIL/uL — ABNORMAL LOW (ref 3.87–5.11)
RDW: 13.2 % (ref 11.5–15.5)
WBC: 4.9 10*3/uL (ref 4.0–10.5)
nRBC: 0 % (ref 0.0–0.2)

## 2021-02-07 LAB — HEPATIC FUNCTION PANEL
ALT: 11 U/L (ref 0–44)
AST: 15 U/L (ref 15–41)
Albumin: 3.4 g/dL — ABNORMAL LOW (ref 3.5–5.0)
Alkaline Phosphatase: 65 U/L (ref 38–126)
Bilirubin, Direct: <0.1 mg/dL (ref 0.0–0.2)
Total Bilirubin: 0.7 mg/dL (ref 0.3–1.2)
Total Protein: 7 g/dL (ref 6.5–8.1)

## 2021-02-07 LAB — URINALYSIS, COMPLETE (UACMP) WITH MICROSCOPIC
Bilirubin Urine: NEGATIVE
Glucose, UA: 50 mg/dL — AB
Hgb urine dipstick: NEGATIVE
Ketones, ur: NEGATIVE mg/dL
Leukocytes,Ua: NEGATIVE
Nitrite: NEGATIVE
Protein, ur: 100 mg/dL — AB
Specific Gravity, Urine: 1.01 (ref 1.005–1.030)
pH: 7 (ref 5.0–8.0)

## 2021-02-07 LAB — BASIC METABOLIC PANEL
Anion gap: 7 (ref 5–15)
BUN: 42 mg/dL — ABNORMAL HIGH (ref 8–23)
CO2: 26 mmol/L (ref 22–32)
Calcium: 8.7 mg/dL — ABNORMAL LOW (ref 8.9–10.3)
Chloride: 103 mmol/L (ref 98–111)
Creatinine, Ser: 3.19 mg/dL — ABNORMAL HIGH (ref 0.44–1.00)
GFR, Estimated: 16 mL/min — ABNORMAL LOW (ref >60)
Glucose, Bld: 194 mg/dL — ABNORMAL HIGH (ref 70–99)
Potassium: 5.8 mmol/L — ABNORMAL HIGH (ref 3.5–5.1)
Sodium: 136 mmol/L (ref 135–145)

## 2021-02-07 LAB — LIPASE, BLOOD: Lipase: 65 U/L — ABNORMAL HIGH (ref 11–51)

## 2021-02-07 LAB — TROPONIN I (HIGH SENSITIVITY)
Troponin I (High Sensitivity): 6 ng/L (ref <18)
Troponin I (High Sensitivity): 9 ng/L (ref <18)

## 2021-02-07 MED ORDER — CEPHALEXIN 500 MG PO CAPS: 500.0000 mg | ORAL_CAPSULE | Freq: Three times a day (TID) | ORAL | 0 refills | Status: AC

## 2021-02-07 MED ORDER — SODIUM POLYSTYRENE SULFONATE PO POWD: Freq: Every day | ORAL | 0 refills | Status: AC

## 2021-02-07 MED ORDER — ONDANSETRON HCL 4 MG/2ML IJ SOLN
4.0000 mg | Freq: Once | INTRAMUSCULAR | Status: AC
  Administered 2021-02-07: 4 mg via INTRAVENOUS
  Filled 2021-02-07: qty 2

## 2021-02-07 NOTE — ED Notes (Signed)
Lab contacted to obtain troponin.

## 2021-02-07 NOTE — ED Triage Notes (Signed)
Pt states that she hasn't felt well for the past couple days, states that she has been having some dizziness and nausea, occasional sob

## 2021-02-07 NOTE — ED Notes (Signed)
This RN attempts PIV x2 - un successful. 2nd RN at bedside.   MD at bedside to assess. Pt states she has been having sx of SOB, lightheadedness x 3 days. Endorses nausea, - emesis today while in shower.   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 Able to ambulate with steady gait to bed, changed into gown, placed on cardiac monitor. VS cycled. MD aware of HTN.

## 2021-02-07 NOTE — Discharge Instructions (Signed)
I suspect your symptoms today are from mild dehydration and a UTI.  HOLD your lasix for 1 day then resume  I'd recommend checking your blood pressure at least once/day at home. If it remains elevated, call your primary doctor.  Follow-up with your doctor in the next week

## 2021-02-07 NOTE — Telephone Encounter (Signed)
Kayexalate called in as this had been left off at time of discharge. Pt called to remind her to pick this up with her antibiotic.  15g dose x 2.

## 2021-02-07 NOTE — ED Notes (Signed)
Pt OTF with imaging.   Prompted to provide urine sample. Will try when able.

## 2021-02-07 NOTE — ED Provider Notes (Signed)
[facility] Emergency Department Provider Note  ____________________________________________   Event Date/Time   First MD Initiated Contact with Patient 02/07/21 1200     (approximate)  I have reviewed the triage vital signs and the nursing notes.   HISTORY  Chief Complaint Dizziness    HPI Amanda Montgomery is a 65 y.o. female with past medical history of hypertension, diabetes, GERD, coronary disease, here with mild nausea, urinary frequency, and lightheadedness per the patient states that for the last several days, she has felt generally fatigued.  She has been having some foul-smelling urine and slight frequency, though she is on Lasix so this is somewhat baseline.  She has had associated mild nausea and has noted some lightheadedness, particular when she stands up.  She is been eating and drinking normally.  Denies any medication changes.  No dizziness or vertigo type feelings.  No focal numbness or weakness.  She states that she presents for further evaluation due to just her persistent mild lightheadedness.  She notes her blood pressure has been slightly elevated, but this is also not uncommon for her.  Her heart rate has been running in the 50s at home which is her baseline.  No vomiting.  No diarrhea or constipation.  No abdominal pain.    Past Medical History:  Diagnosis Date   CAD (coronary artery disease)    Diabetes mellitus without complication ([place])    GERD (gastroesophageal reflux disease)    Hypertension    Renal disorder     Patient Active Problem List   Diagnosis Date Noted   Type 2 diabetes mellitus with stage 4 chronic kidney disease ([place]) 08/20/2020   Chest pain 08/19/2020    Past Surgical History:  Procedure Laterality Date   ABDOMINAL HYSTERECTOMY     BACK SURGERY     CARDIAC SURGERY     CARPAL TUNNEL RELEASE     CORONARY ANGIOPLASTY WITH STENT PLACEMENT     REVISION OF SCAR TISSUE RECTUS MUSCLE     TRIGGER FINGER RELEASE       Prior to Admission medications   Medication Sig Start Date End Date Taking? Authorizing Provider  cephALEXin (KEFLEX) 500 MG capsule Take 1 capsule (500 mg total) by mouth 3 (three) times daily for 5 days. 02/07/21 02/12/21 Yes Duffy Bruce, MD  acetaminophen (TYLENOL) 325 MG tablet Take 325-650 mg by mouth every 6 (six) hours as needed for pain.    [provider]  albuterol (VENTOLIN HFA) 108 (90 Base) MCG/ACT inhaler Inhale 2 puffs into the lungs every 4 (four) hours as needed for shortness of breath or wheezing.    [provider]  aspirin 81 MG EC tablet Take 81 mg by mouth daily.    [provider]  chlorthalidone (HYGROTON) 25 MG tablet Take 25 mg by mouth daily.    [provider]  Cholecalciferol 50 MCG (2000 UT) CAPS Take 2,000 Units by mouth daily.    [provider]  cloNIDine (CATAPRES) 0.1 MG tablet Take 3 tablets (0.3 mg total) by mouth 2 (two) times daily. 08/20/20   Sharen Hones, MD  clopidogrel (PLAVIX) 75 MG tablet Take 75 mg by mouth daily.    [provider]  cyclobenzaprine (FLEXERIL) 5 MG tablet Take 5 mg by mouth 3 (three) times daily as needed for muscle spasms.    [provider]  famotidine (PEPCID) 20 MG tablet Take 20 mg by mouth at bedtime.    [provider]  ferrous  sulfate 325 (65 FE) MG tablet Take 325 mg by mouth daily with breakfast.    [provider]  furosemide (LASIX) 40 MG tablet Take 40 mg by mouth 2 (two) times daily.    [provider]  insulin degludec (TRESIBA FLEXTOUCH) 200 UNIT/ML FlexTouch Pen Inject 56 Units into the skin at bedtime.    [provider]  insulin lispro (HUMALOG) 100 UNIT/ML KwikPen Inject 20 Units into the skin 3 (three) times daily with meals. (according to sliding scale)    [provider]  isosorbide mononitrate (IMDUR) 60 MG 24 hr tablet Take 60 mg by mouth daily.    [provider]  levothyroxine  (SYNTHROID) 75 MCG tablet Take 75 mcg by mouth daily.    [provider]  lisinopril (ZESTRIL) 20 MG tablet Take 20 mg by mouth daily.    [provider]  nitroGLYCERIN (NITROSTAT) 0.4 MG SL tablet Place 0.4 mg under the tongue every 5 (five) minutes x 3 doses as needed for chest pain.    [provider]  pantoprazole (PROTONIX) 40 MG tablet Take 40 mg by mouth daily.    [provider]  rosuvastatin (CRESTOR) 10 MG tablet Take 10 mg by mouth at bedtime.    [provider]  senna (SENOKOT) 8.6 MG tablet Take 8.6 mg by mouth every morning.    [provider]  sodium bicarbonate 650 MG tablet Take 650 mg by mouth daily.    [provider]  sodium polystyrene (KAYEXALATE) powder Take by mouth daily for 2 doses. Take 15 g by mouth daily for 2 doses. 02/07/21 02/09/21  Duffy Bruce, MD  sucralfate (CARAFATE) 1 g tablet Take 1 g by mouth 4 (four) times daily -  before meals and at bedtime.    [provider]    Allergies Atorvastatin, Amlodipine, Shrimp extract allergy skin test, and Sulfa antibiotics  No family history on file.  Social History Social History   Tobacco Use   Smoking status: Former   Smokeless tobacco: Never  Substance Use Topics   Alcohol use: Not Currently   Drug use: Not Currently    Review of Systems  Review of Systems  Constitutional:  Positive for fatigue. Negative for fever.  HENT:  Negative for congestion and sore throat.   Eyes:  Negative for visual disturbance.  Respiratory:  Negative for cough and shortness of breath.   Cardiovascular:  Negative for chest pain.  Gastrointestinal:  Negative for abdominal pain, diarrhea, nausea and vomiting.  Genitourinary:  Positive for frequency. Negative for flank pain.  Musculoskeletal:  Negative for back pain and neck pain.  Skin:  Negative for rash and wound.  Neurological:  Positive for light-headedness. Negative for weakness.  All other systems  reviewed and are negative.   ____________________________________________  PHYSICAL EXAM:      VITAL SIGNS: ED Triage Vitals  Enc Vitals Group     BP 02/07/21 1050 (!) 207/82     Pulse Rate 02/07/21 1050 (!) 55     Resp 02/07/21 1050 16     Temp 02/07/21 1050 98.1 F (36.7 C)     Temp Source 02/07/21 1050 Oral     SpO2 02/07/21 1050 100 %     Weight 02/07/21 1051 258 lb (117 kg)     Height 02/07/21 1051 '5\' 9"'$  (1.753 m)     Head Circumference --      Peak Flow --      Pain Score 02/07/21 1051 0  Pain Loc --      Pain Edu? --      Excl. in [place]? --      Physical Exam Vitals and nursing note reviewed.  Constitutional:      General: She is not in acute distress.    Appearance: She is well-developed.  HENT:     Head: Normocephalic and atraumatic.  Eyes:     Conjunctiva/sclera: Conjunctivae normal.  Cardiovascular:     Rate and Rhythm: Normal rate and regular rhythm.     Heart sounds: Normal heart sounds.  Pulmonary:     Effort: Pulmonary effort is normal. No respiratory distress.     Breath sounds: No wheezing.  Abdominal:     General: There is no distension.  Musculoskeletal:     Cervical back: Neck supple.  Skin:    General: Skin is warm.     Capillary Refill: Capillary refill takes less than 2 seconds.     Findings: No rash.  Neurological:     Mental Status: She is alert and oriented to person, place, and time.     Motor: No abnormal muscle tone.      ____________________________________________   LABS (all labs ordered are listed, but only abnormal results are displayed)  Labs Reviewed  BASIC METABOLIC PANEL - Abnormal; Notable for the following components:      Result Value   Potassium 5.8 (*)    Glucose, Bld 194 (*)    BUN 42 (*)    Creatinine, Ser 3.19 (*)    Calcium 8.7 (*)    GFR, Estimated 16 (*)    All other components within normal limits  CBC - Abnormal; Notable for the following components:   RBC 3.44 (*)    Hemoglobin 10.1 (*)     HCT 29.8 (*)    All other components within normal limits  URINALYSIS, COMPLETE (UACMP) WITH MICROSCOPIC - Abnormal; Notable for the following components:   Color, Urine STRAW (*)    APPearance CLEAR (*)    Glucose, UA 50 (*)    Protein, ur 100 (*)    Bacteria, UA RARE (*)    All other components within normal limits  HEPATIC FUNCTION PANEL - Abnormal; Notable for the following components:   Albumin 3.4 (*)    All other components within normal limits  LIPASE, BLOOD - Abnormal; Notable for the following components:   Lipase 65 (*)    All other components within normal limits  URINE CULTURE  CBG MONITORING, ED  TROPONIN I (HIGH SENSITIVITY)  TROPONIN I (HIGH SENSITIVITY)    ____________________________________________  EKG: Sinus bradycardia, ventricular rate 51.  PR 166, QRS 86, QTc 396.  No acute ST elevations or depressions.  No acute evidence of acute ischemia or infarct. ________________________________________  RADIOLOGY All imaging, including plain films, CT scans, and ultrasounds, independently reviewed by me, and interpretations confirmed via formal radiology reads.  ED MD interpretation:   CT Head: NAICA CXR: Atelectasis no focal consolidation  Official radiology report(s): DG Chest 2 View  Result Date: 02/07/2021 CLINICAL DATA:  Weakness and nausea. EXAM: CHEST - 2 VIEW COMPARISON:  10/25/2020 FINDINGS: 1242 hours. Low lung volumes. Cardiopericardial silhouette is at upper limits of normal for size. Mild vascular congestion without pulmonary edema. No focal airspace consolidation, pneumothorax, or pleural effusion. Trace atelectasis at the lung bases. The visualized bony structures of the thorax show no acute abnormality. Telemetry leads overlie the chest. IMPRESSION: Low lung volumes with trace atelectasis at the bases. No edema  or focal airspace consolidation. No pleural effusion. Electronically Signed   By: Misty Stanley M.D.   On: 02/07/2021 13:17   CT HEAD WO  CONTRAST (5MM)  Result Date: 02/07/2021 CLINICAL DATA:  Dizziness and nausea. EXAM: CT HEAD WITHOUT CONTRAST TECHNIQUE: Contiguous axial images were obtained from the base of the skull through the vertex without intravenous contrast. COMPARISON:  08/25/2008 FINDINGS: Brain: There is no evidence for acute hemorrhage, hydrocephalus, mass lesion, or abnormal extra-axial fluid collection. No definite CT evidence for acute infarction. Vascular: No hyperdense vessel or unexpected calcification. Skull: No evidence for fracture. No worrisome lytic or sclerotic lesion. Sinuses/Orbits: The visualized paranasal sinuses and mastoid air cells are clear. Visualized portions of the globes and intraorbital fat are unremarkable. Other: None. IMPRESSION: Unremarkable.  No acute intracranial abnormality. Electronically Signed   By: Misty Stanley M.D.   On: 02/07/2021 13:19    ____________________________________________  PROCEDURES   Procedure(s) performed (including Critical Care):  Procedures  ____________________________________________  INITIAL IMPRESSION / MDM / [place] / ED COURSE  As part of my medical decision making, I reviewed the following data within the [place] notes reviewed and incorporated, Old chart reviewed, Notes from prior ED visits, and [place] Controlled Substance Database       *BRESEIS DELMAN was evaluated in Emergency Department on 02/07/2021 for the symptoms described in the history of present illness. She was evaluated in the context of the global COVID-19 pandemic, which necessitated consideration that the patient might be at risk for infection with the SARS-CoV-2 virus that causes COVID-19. Institutional protocols and algorithms that pertain to the evaluation of patients at risk for COVID-19 are in a state of rapid change based on information released by regulatory bodies including the CDC and federal and state organizations. These policies and  algorithms were followed during the patient's care in the ED.  Some ED evaluations and interventions may be delayed as a result of limited staffing during the pandemic.*     Medical Decision Making:  65 yo female here with multiple complaints, primarily mild lightheadedness, general fatigue, and foul-smelling urine. Overall, pt is well appearing and in NAD. BMP shows baseline CKD with Cr 3.19 - last value was 3.27 by PCP. K 5.8 - unclear significant, EKG is unremarkable, suspect dehydration and CKD - encouraged fluids, given PO here, and close PCP f/u. Kayexalate dose x 2 given (no abd pain or signs of GI distress) to help prevent worsening hyperK. Otherwise, UA does show proteinuria and bacteria. Trop neg, EKG nonischemic. CT head negative. CXR is clear. Telemetry w/o ectopy.  Suspect mild dehydration, possible UTI with bacteria in urine and foulsmelling urine w/ symptoms. Will tx with keflex, encourage hydration. Kayexalate for asymptomatic hyperK without EKG changes or ectopy.   ____________________________________________  FINAL CLINICAL IMPRESSION(S) / ED DIAGNOSES  Final diagnoses:  Dizziness  Acute cystitis without hematuria     MEDICATIONS GIVEN DURING THIS VISIT:  Medications  ondansetron (ZOFRAN) injection 4 mg (4 mg Intravenous Given 02/07/21 1240)     ED Discharge Orders          Ordered    cephALEXin (KEFLEX) 500 MG capsule  3 times daily        02/07/21 1412             Note:  This document was prepared using Dragon voice recognition software and may include unintentional dictation errors.   Duffy Bruce, MD 02/07/21 1743

## 2021-02-09 LAB — URINE CULTURE: Culture: <10000 — AB

## 2021-03-26 ENCOUNTER — Encounter: Payer: Self-pay | Admitting: Emergency Medicine

## 2021-03-26 ENCOUNTER — Other Ambulatory Visit: Payer: Self-pay

## 2021-03-26 ENCOUNTER — Emergency Department: Payer: 59

## 2021-03-26 DIAGNOSIS — Z87891 Personal history of nicotine dependence: Secondary | ICD-10-CM | POA: Insufficient documentation

## 2021-03-26 DIAGNOSIS — N184 Chronic kidney disease, stage 4 (severe): Secondary | ICD-10-CM | POA: Insufficient documentation

## 2021-03-26 DIAGNOSIS — E1122 Type 2 diabetes mellitus with diabetic chronic kidney disease: Secondary | ICD-10-CM | POA: Diagnosis not present

## 2021-03-26 DIAGNOSIS — I129 Hypertensive chronic kidney disease with stage 1 through stage 4 chronic kidney disease, or unspecified chronic kidney disease: Secondary | ICD-10-CM | POA: Insufficient documentation

## 2021-03-26 DIAGNOSIS — I2510 Atherosclerotic heart disease of native coronary artery without angina pectoris: Secondary | ICD-10-CM | POA: Insufficient documentation

## 2021-03-26 DIAGNOSIS — Z79899 Other long term (current) drug therapy: Secondary | ICD-10-CM | POA: Insufficient documentation

## 2021-03-26 DIAGNOSIS — R519 Headache, unspecified: Secondary | ICD-10-CM | POA: Diagnosis not present

## 2021-03-26 DIAGNOSIS — Z951 Presence of aortocoronary bypass graft: Secondary | ICD-10-CM | POA: Insufficient documentation

## 2021-03-26 DIAGNOSIS — Z7982 Long term (current) use of aspirin: Secondary | ICD-10-CM | POA: Diagnosis not present

## 2021-03-26 DIAGNOSIS — Z7902 Long term (current) use of antithrombotics/antiplatelets: Secondary | ICD-10-CM | POA: Insufficient documentation

## 2021-03-26 DIAGNOSIS — L03116 Cellulitis of left lower limb: Secondary | ICD-10-CM | POA: Diagnosis not present

## 2021-03-26 DIAGNOSIS — Z794 Long term (current) use of insulin: Secondary | ICD-10-CM | POA: Insufficient documentation

## 2021-03-26 DIAGNOSIS — R42 Dizziness and giddiness: Secondary | ICD-10-CM | POA: Insufficient documentation

## 2021-03-26 DIAGNOSIS — M79605 Pain in left leg: Secondary | ICD-10-CM | POA: Diagnosis present

## 2021-03-26 LAB — CBC
HCT: 30.4 % — ABNORMAL LOW (ref 36.0–46.0)
Hemoglobin: 10.1 g/dL — ABNORMAL LOW (ref 12.0–15.0)
MCH: 28.8 pg (ref 26.0–34.0)
MCHC: 33.2 g/dL (ref 30.0–36.0)
MCV: 86.6 fL (ref 80.0–100.0)
Platelets: 173 10*3/uL (ref 150–400)
RBC: 3.51 MIL/uL — ABNORMAL LOW (ref 3.87–5.11)
RDW: 14 % (ref 11.5–15.5)
WBC: 7 10*3/uL (ref 4.0–10.5)
nRBC: 0 % (ref 0.0–0.2)

## 2021-03-26 LAB — BASIC METABOLIC PANEL
Anion gap: 7 (ref 5–15)
BUN: 53 mg/dL — ABNORMAL HIGH (ref 8–23)
CO2: 24 mmol/L (ref 22–32)
Calcium: 8.3 mg/dL — ABNORMAL LOW (ref 8.9–10.3)
Chloride: 104 mmol/L (ref 98–111)
Creatinine, Ser: 2.92 mg/dL — ABNORMAL HIGH (ref 0.44–1.00)
GFR, Estimated: 17 mL/min — ABNORMAL LOW (ref >60)
Glucose, Bld: 261 mg/dL — ABNORMAL HIGH (ref 70–99)
Potassium: 4.7 mmol/L (ref 3.5–5.1)
Sodium: 135 mmol/L (ref 135–145)

## 2021-03-26 NOTE — ED Triage Notes (Signed)
Pt arrived via POV with c/o L leg pain and swelling x 2 days, pt reports the calf area is tender to the touch, warm and red.  Pt had bypass surgery in May and reports problem since then, but has been worse x 2 days.  Pt denies any hx of blood clots, but was on blood thinners prior to by-pass.

## 2021-03-27 ENCOUNTER — Emergency Department: Payer: 59

## 2021-03-27 ENCOUNTER — Other Ambulatory Visit: Payer: Self-pay

## 2021-03-27 ENCOUNTER — Emergency Department
Admission: EM | Admit: 2021-03-27 | Discharge: 2021-03-27 | Disposition: A | Discharge status: Home / Self Care | Payer: 59 | Attending: Emergency Medicine | Admitting: Emergency Medicine

## 2021-03-27 DIAGNOSIS — L03116 Cellulitis of left lower limb: Secondary | ICD-10-CM | POA: Diagnosis not present

## 2021-03-27 DIAGNOSIS — I10 Essential (primary) hypertension: Secondary | ICD-10-CM

## 2021-03-27 LAB — URINALYSIS, ROUTINE W REFLEX MICROSCOPIC
Bacteria, UA: NONE SEEN
Bilirubin Urine: NEGATIVE
Glucose, UA: 150 mg/dL — AB
Ketones, ur: NEGATIVE mg/dL
Leukocytes,Ua: NEGATIVE
Nitrite: NEGATIVE
Protein, ur: >=300 mg/dL — AB
Specific Gravity, Urine: 1.016 (ref 1.005–1.030)
pH: 5 (ref 5.0–8.0)

## 2021-03-27 MED ORDER — CEPHALEXIN 500 MG PO CAPS
500.0000 mg | ORAL_CAPSULE | Freq: Once | ORAL | Status: AC
  Administered 2021-03-27: 500 mg via ORAL
  Filled 2021-03-27: qty 1

## 2021-03-27 MED ORDER — CEPHALEXIN 500 MG PO CAPS: 500.0000 mg | ORAL_CAPSULE | Freq: Four times a day (QID) | ORAL | 0 refills | Status: AC

## 2021-03-27 MED ORDER — CLONIDINE HCL 0.3 MG PO TABS: 0.3000 mg | ORAL_TABLET | Freq: Two times a day (BID) | ORAL | 1 refills | Status: AC

## 2021-03-27 MED ORDER — ACETAMINOPHEN 500 MG PO TABS
1000.0000 mg | ORAL_TABLET | Freq: Once | ORAL | Status: AC
  Administered 2021-03-27: 1000 mg via ORAL
  Filled 2021-03-27: qty 2

## 2021-03-27 MED ORDER — CLONIDINE HCL 0.1 MG PO TABS
0.1000 mg | ORAL_TABLET | Freq: Once | ORAL | Status: AC
  Administered 2021-03-27: 0.1 mg via ORAL
  Filled 2021-03-27: qty 1

## 2021-03-27 NOTE — Discharge Instructions (Addendum)
I recommend that you call to schedule appoint with your primary care doctor this week.  You may take over-the-counter Tylenol 1000 mg every 6 hours as needed for pain.  Please take your antibiotics until complete.  I recommend that you start a probiotic daily while on antibiotics.  This can be found over-the-counter.

## 2021-03-27 NOTE — ED Provider Notes (Signed)
[facility] Emergency Department Provider Note  ____________________________________________   Event Date/Time   First MD Initiated Contact with Patient 03/27/21 0236     (approximate)  I have reviewed the triage vital signs and the nursing notes.   HISTORY  Chief Complaint Leg Swelling    HPI Amanda Montgomery is a 65 y.o. female with history of hypertension, diabetes, chronic kidney disease, CAD status post CABG who presents to the emergency department with complaints of 2 days of left leg pain.  States it feels more swollen and a little more warm than it normally does.  No injury.  She states that she has had pain and swelling in this leg since having her saphenous vein harvested from this leg for a CABG in May 2022.  No fevers, chest pain, shortness of breath, vomiting, diarrhea.  States she has had some intermittent headaches and lightheadedness recently.  No blurry vision, diplopia, vision loss, numbness, tingling or weakness.  Blood pressure is extremely elevated today.  She states this is not abnormal for her blood pressure to become very elevated at night and then improved when she takes her medications.  She states she is on metoprolol twice a day and clonidine 0.2 mg twice daily.  She states she has not missed any doses and did take her medications while in the waiting room.        Past Medical History:  Diagnosis Date   CAD (coronary artery disease)    Diabetes mellitus without complication ([place])    GERD (gastroesophageal reflux disease)    Hypertension    Renal disorder     Patient Active Problem List   Diagnosis Date Noted   Type 2 diabetes mellitus with stage 4 chronic kidney disease ([place]) 08/20/2020   Chest pain 08/19/2020    Past Surgical History:  Procedure Laterality Date   ABDOMINAL HYSTERECTOMY     BACK SURGERY     CARDIAC SURGERY     CARPAL TUNNEL RELEASE     CORONARY ANGIOPLASTY WITH STENT PLACEMENT     REVISION OF SCAR  TISSUE RECTUS MUSCLE     TRIGGER FINGER RELEASE      Prior to Admission medications   Medication Sig Start Date End Date Taking? Authorizing Provider  acetaminophen (TYLENOL) 325 MG tablet Take 325-650 mg by mouth every 6 (six) hours as needed for pain.    [provider]  albuterol (VENTOLIN HFA) 108 (90 Base) MCG/ACT inhaler Inhale 2 puffs into the lungs every 4 (four) hours as needed for shortness of breath or wheezing.    [provider]  aspirin 81 MG EC tablet Take 81 mg by mouth daily.    [provider]  chlorthalidone (HYGROTON) 25 MG tablet Take 25 mg by mouth daily.    [provider]  Cholecalciferol 50 MCG (2000 UT) CAPS Take 2,000 Units by mouth daily.    [provider]  cloNIDine (CATAPRES) 0.1 MG tablet Take 3 tablets (0.3 mg total) by mouth 2 (two) times daily. 08/20/20   Sharen Hones, MD  clopidogrel (PLAVIX) 75 MG tablet Take 75 mg by mouth daily.    [provider]  cyclobenzaprine (FLEXERIL) 5 MG tablet Take 5 mg by mouth 3 (three) times daily as needed for muscle spasms.    [provider]  famotidine (PEPCID) 20 MG tablet Take 20 mg by mouth at bedtime.    [provider]  ferrous sulfate 325 (65 FE) MG tablet Take 325 mg  by mouth daily with breakfast.    [provider]  furosemide (LASIX) 40 MG tablet Take 40 mg by mouth 2 (two) times daily.    [provider]  insulin degludec (TRESIBA FLEXTOUCH) 200 UNIT/ML FlexTouch Pen Inject 56 Units into the skin at bedtime.    [provider]  insulin lispro (HUMALOG) 100 UNIT/ML KwikPen Inject 20 Units into the skin 3 (three) times daily with meals. (according to sliding scale)    [provider]  isosorbide mononitrate (IMDUR) 60 MG 24 hr tablet Take 60 mg by mouth daily.    [provider]  levothyroxine (SYNTHROID) 75 MCG tablet Take 75 mcg by mouth daily.    [provider]  lisinopril (ZESTRIL) 20 MG  tablet Take 20 mg by mouth daily.    [provider]  nitroGLYCERIN (NITROSTAT) 0.4 MG SL tablet Place 0.4 mg under the tongue every 5 (five) minutes x 3 doses as needed for chest pain.    [provider]  pantoprazole (PROTONIX) 40 MG tablet Take 40 mg by mouth daily.    [provider]  rosuvastatin (CRESTOR) 10 MG tablet Take 10 mg by mouth at bedtime.    [provider]  senna (SENOKOT) 8.6 MG tablet Take 8.6 mg by mouth every morning.    [provider]  sodium bicarbonate 650 MG tablet Take 650 mg by mouth daily.    [provider]  sucralfate (CARAFATE) 1 g tablet Take 1 g by mouth 4 (four) times daily -  before meals and at bedtime.    [provider]    Allergies Amlodipine, Atorvastatin, Shrimp extract allergy skin test, and Sulfa antibiotics  History reviewed. No pertinent family history.  Social History Social History   Tobacco Use   Smoking status: Former   Smokeless tobacco: Never  Substance Use Topics   Alcohol use: Not Currently   Drug use: Not Currently    Review of Systems Constitutional: No fever. Eyes: No visual changes. ENT: No sore throat. Cardiovascular: Denies chest pain. Respiratory: Denies shortness of breath. Gastrointestinal: No nausea, vomiting, diarrhea. Genitourinary: Negative for dysuria. Musculoskeletal: Negative for back pain. Skin: Negative for rash. Neurological: Negative for focal weakness or numbness.  ____________________________________________   PHYSICAL EXAM:  VITAL SIGNS: ED Triage Vitals  Enc Vitals Group     BP 03/26/21 2153 (!) 237/80     Pulse Rate 03/26/21 2153 (!) 57     Resp 03/26/21 2153 20     Temp 03/26/21 2153 98.6 F (37 C)     Temp Source 03/26/21 2153 Oral     SpO2 03/26/21 2153 97 %     Weight 03/26/21 2155 262 lb (118.8 kg)     Height 03/26/21 2155 5' 8.5" (1.74 m)     Head Circumference --      Peak Flow --      Pain Score 03/26/21 2154 7      Pain Loc --      Pain Edu? --      Excl. in [place]? --    CONSTITUTIONAL: Alert and oriented and responds appropriately to questions. Well-appearing; well-nourished, obese, in no distress HEAD: Normocephalic, atraumatic EYES: Conjunctivae clear, pupils appear equal, EOM appear intact ENT: normal nose; moist mucous membranes NECK: Supple, normal ROM CARD: RRR; S1 and S2 appreciated; no murmurs, no clicks, no rubs, no gallops RESP: Normal chest excursion without splinting or tachypnea; breath sounds clear and equal bilaterally; no wheezes, no rhonchi, no rales,  no hypoxia or respiratory distress, speaking full sentences ABD/GI: Normal bowel sounds; non-distended; soft, non-tender, no rebound, no guarding, no peritoneal signs, no hepatosplenomegaly BACK: The back appears normal EXT: Patient does have some mild soft tissue swelling noted to the left calf compared to the right with induration and very minimal warmth but no redness appreciated.  There is no fluctuance.  Compartments are soft.  She does have some mild calf tenderness on the left.  I am able to Doppler a 2+ biphasic DP and PT pulse in the left lower extremity.  No joint effusions.  Able to ambulate without difficulty. SKIN: Normal color for age and race; warm; no rash on exposed skin NEURO: Moves all extremities equally, normal sensation diffusely, cranial nerves II through XII intact, normal speech PSYCH: The patient's mood and manner are appropriate.  ____________________________________________   LABS (all labs ordered are listed, but only abnormal results are displayed)  Labs Reviewed  BASIC METABOLIC PANEL - Abnormal; Notable for the following components:      Result Value   Glucose, Bld 261 (*)    BUN 53 (*)    Creatinine, Ser 2.92 (*)    Calcium 8.3 (*)    GFR, Estimated 17 (*)    All other components within normal limits  CBC - Abnormal; Notable for the following components:   RBC 3.51 (*)    Hemoglobin 10.1 (*)     HCT 30.4 (*)    All other components within normal limits  URINALYSIS, ROUTINE W REFLEX MICROSCOPIC   ____________________________________________  EKG   Date: 03/27/2021  Rate: 46  Rhythm: Slow sinus arrhythmia  QRS Axis: normal  Intervals: normal  ST/T Wave abnormalities: Lateral T wave inversions  Conduction Disutrbances: none  Narrative Interpretation: No significant change compared to previous EKG in August 2022    ____________________________________________  RADIOLOGY Jessie Foot Allyssia Skluzacek, personally viewed and evaluated these images (plain radiographs) as part of my medical decision making, as well as reviewing the written report by the radiologist.  ED MD interpretation: CT head shows no acute abnormality.  Venous Doppler of the left lower extremity shows no DVT.  Official radiology report(s): CT HEAD WO CONTRAST (5MM)  Result Date: 03/27/2021 CLINICAL DATA:  Headaches EXAM: CT HEAD WITHOUT CONTRAST TECHNIQUE: Contiguous axial images were obtained from the base of the skull through the vertex without intravenous contrast. COMPARISON:  02/07/2021 FINDINGS: Brain: No evidence of acute infarction, hemorrhage, hydrocephalus, extra-axial collection or mass lesion/mass effect. Vascular: No hyperdense vessel or unexpected calcification. Skull: Normal. Negative for fracture or focal lesion. Sinuses/Orbits: No acute finding. Other: None. IMPRESSION: No acute intracranial abnormality noted. Electronically Signed   By: Inez Catalina M.D.   On: 03/27/2021 03:35   US Venous Img Lower Unilateral Left  Result Date: 03/26/2021 CLINICAL DATA:  Left leg pain and swelling for 2 days EXAM: LEFT LOWER EXTREMITY VENOUS DOPPLER ULTRASOUND TECHNIQUE: Gray-scale sonography with graded compression, as well as color Doppler and duplex ultrasound were performed to evaluate the lower extremity deep venous systems from the level of the common femoral vein and including the common femoral, femoral, profunda  femoral, popliteal and calf veins including the posterior tibial, peroneal and gastrocnemius veins when visible. The superficial great saphenous vein was also interrogated. Spectral Doppler was utilized to evaluate flow at rest and with distal augmentation maneuvers in the common femoral, femoral and popliteal veins. COMPARISON:  None. FINDINGS: Contralateral Common Femoral Vein: Respiratory phasicity is normal and symmetric with the symptomatic side. No evidence  of thrombus. Normal compressibility. Common Femoral Vein: No evidence of thrombus. Normal compressibility, respiratory phasicity and response to augmentation. Saphenofemoral Junction: No evidence of thrombus. Normal compressibility and flow on color Doppler imaging. Profunda Femoral Vein: No evidence of thrombus. Normal compressibility and flow on color Doppler imaging. Femoral Vein: No evidence of thrombus. Normal compressibility, respiratory phasicity and response to augmentation. Popliteal Vein: No evidence of thrombus. Normal compressibility, respiratory phasicity and response to augmentation. Calf Veins: No evidence of thrombus. Normal compressibility and flow on color Doppler imaging. Superficial Great Saphenous Vein: Previously harvested for coronary bypass grafting several months ago. In the area of prior incision some localized edema is noted. No sizable fluid collection is seen. Venous Reflux:  None. Other Findings:  None. IMPRESSION: No deep venous thrombosis is noted. Changes consistent with prior saphenous vein harvesting with apparent edema at the previous surgical site. This may represent a small postoperative seroma. Electronically Signed   By: Inez Catalina M.D.   On: 03/26/2021 23:46    ____________________________________________   PROCEDURES  Procedure(s) performed (including Critical Care):  Procedures   ____________________________________________   INITIAL IMPRESSION / ASSESSMENT AND PLAN / ED COURSE  As part of my  medical decision making, I reviewed the following data within the [place] notes reviewed and incorporated, Labs reviewed , EKG interpreted , Old EKG reviewed, ultrasound and CT reviewed, and Notes from prior ED visits         Patient here with left leg swelling, induration, warmth.  No systemic symptoms.  Could be early cellulitis.  We will start her on Keflex.  Ultrasound shows no DVT and she has strong dopplerable PT and DP pulses.  No sign of joint effusion to suggest septic arthritis, gout.  Compartments are soft.  No injury to suggest fracture.  Patient request Tylenol for pain.  Patient's blood pressure is also elevated today.  She is on clonidine and metoprolol.  It appears she is on clonidine 0.2 mg twice daily but may need to be on 0.3 mg twice daily which was what she was prescribed in February 2022 during a previous hospitalization.  It is unclear from patient why or who decreased this dose.  We will give an extra dose of clonidine here and closely monitor.  Will obtain EKG, urine to evaluate for signs of endorgan damage.  She does have chronic kidney disease but this appears stable.  She denies any chest pain or shortness of breath.  States she is having some headache.  Will obtain CT of the head to evaluate for intracranial hemorrhage.  She is neurologically intact.  ED PROGRESS  CT head shows no acute abnormality.  Urine shows proteinuria which is chronic for patient no other acute abnormality.  She reports feeling better.  Will discharge with Keflex for early left lower extremity cellulitis.  I have suggested that we put her back on her clonidine 0.3 mg twice daily given it seems to have helped her blood pressure and it is now in the 170s/70s.  She agrees with this plan but I have encouraged her to to schedule very close outpatient follow-up with her primary care provider.  Discussed return precautions.  I feel she is safe for discharge home.   At this time,  I do not feel there is any life-threatening condition present. I have reviewed, interpreted and discussed all results (EKG, imaging, lab, urine as appropriate) and exam findings with patient/family. I have reviewed nursing notes and appropriate previous records.  I feel the patient is safe to be discharged home without further emergent workup and can continue workup as an outpatient as needed. Discussed usual and customary return precautions. Patient/family verbalize understanding and are comfortable with this plan.  Outpatient follow-up has been provided as needed. All questions have been answered.  ____________________________________________   FINAL CLINICAL IMPRESSION(S) / ED DIAGNOSES  Final diagnoses:  Cellulitis of left lower extremity  Hypertension, unspecified type     ED Discharge Orders     None       *Please note:  Amanda Montgomery was evaluated in Emergency Department on 03/27/2021 for the symptoms described in the history of present illness. She was evaluated in the context of the global COVID-19 pandemic, which necessitated consideration that the patient might be at risk for infection with the SARS-CoV-2 virus that causes COVID-19. Institutional protocols and algorithms that pertain to the evaluation of patients at risk for COVID-19 are in a state of rapid change based on information released by regulatory bodies including the CDC and federal and state organizations. These policies and algorithms were followed during the patient's care in the ED.  Some ED evaluations and interventions may be delayed as a result of limited staffing during and the pandemic.*   Note:  This document was prepared using Dragon voice recognition software and may include unintentional dictation errors.    Doss Cybulski, Delice Bison, DO 03/27/21 [phone]

## 2021-04-12 ENCOUNTER — Emergency Department: Payer: 59

## 2021-04-12 ENCOUNTER — Other Ambulatory Visit: Payer: Self-pay

## 2021-04-12 ENCOUNTER — Emergency Department
Admission: EM | Admit: 2021-04-12 | Discharge: 2021-04-12 | Disposition: A | Discharge status: Home / Self Care | Payer: 59 | Attending: Emergency Medicine | Admitting: Emergency Medicine

## 2021-04-12 DIAGNOSIS — I10 Essential (primary) hypertension: Secondary | ICD-10-CM

## 2021-04-12 DIAGNOSIS — R519 Headache, unspecified: Secondary | ICD-10-CM | POA: Diagnosis present

## 2021-04-12 DIAGNOSIS — Z87891 Personal history of nicotine dependence: Secondary | ICD-10-CM | POA: Diagnosis not present

## 2021-04-12 DIAGNOSIS — G43819 Other migraine, intractable, without status migrainosus: Secondary | ICD-10-CM | POA: Diagnosis not present

## 2021-04-12 DIAGNOSIS — I129 Hypertensive chronic kidney disease with stage 1 through stage 4 chronic kidney disease, or unspecified chronic kidney disease: Secondary | ICD-10-CM | POA: Insufficient documentation

## 2021-04-12 DIAGNOSIS — E1122 Type 2 diabetes mellitus with diabetic chronic kidney disease: Secondary | ICD-10-CM | POA: Diagnosis not present

## 2021-04-12 DIAGNOSIS — Z794 Long term (current) use of insulin: Secondary | ICD-10-CM | POA: Insufficient documentation

## 2021-04-12 DIAGNOSIS — N189 Chronic kidney disease, unspecified: Secondary | ICD-10-CM | POA: Insufficient documentation

## 2021-04-12 LAB — CBC WITH DIFFERENTIAL/PLATELET
Abs Immature Granulocytes: 0.02 10*3/uL (ref 0.00–0.07)
Basophils Absolute: 0 10*3/uL (ref 0.0–0.1)
Basophils Relative: 0 %
Eosinophils Absolute: 0.2 10*3/uL (ref 0.0–0.5)
Eosinophils Relative: 3 %
HCT: 28.6 % — ABNORMAL LOW (ref 36.0–46.0)
Hemoglobin: 9.8 g/dL — ABNORMAL LOW (ref 12.0–15.0)
Immature Granulocytes: 0 %
Lymphocytes Relative: 18 %
Lymphs Abs: 1.1 10*3/uL (ref 0.7–4.0)
MCH: 29.7 pg (ref 26.0–34.0)
MCHC: 34.3 g/dL (ref 30.0–36.0)
MCV: 86.7 fL (ref 80.0–100.0)
Monocytes Absolute: 0.5 10*3/uL (ref 0.1–1.0)
Monocytes Relative: 8 %
Neutro Abs: 4.4 10*3/uL (ref 1.7–7.7)
Neutrophils Relative %: 71 %
Platelets: 225 10*3/uL (ref 150–400)
RBC: 3.3 MIL/uL — ABNORMAL LOW (ref 3.87–5.11)
RDW: 13.6 % (ref 11.5–15.5)
WBC: 6.2 10*3/uL (ref 4.0–10.5)
nRBC: 0 % (ref 0.0–0.2)

## 2021-04-12 LAB — COMPREHENSIVE METABOLIC PANEL
ALT: 16 U/L (ref 0–44)
AST: 15 U/L (ref 15–41)
Albumin: 3.1 g/dL — ABNORMAL LOW (ref 3.5–5.0)
Alkaline Phosphatase: 69 U/L (ref 38–126)
Anion gap: 8 (ref 5–15)
BUN: 60 mg/dL — ABNORMAL HIGH (ref 8–23)
CO2: 25 mmol/L (ref 22–32)
Calcium: 8.8 mg/dL — ABNORMAL LOW (ref 8.9–10.3)
Chloride: 105 mmol/L (ref 98–111)
Creatinine, Ser: 3.25 mg/dL — ABNORMAL HIGH (ref 0.44–1.00)
GFR, Estimated: 15 mL/min — ABNORMAL LOW (ref >60)
Glucose, Bld: 136 mg/dL — ABNORMAL HIGH (ref 70–99)
Potassium: 5 mmol/L (ref 3.5–5.1)
Sodium: 138 mmol/L (ref 135–145)
Total Bilirubin: 0.8 mg/dL (ref 0.3–1.2)
Total Protein: 7.1 g/dL (ref 6.5–8.1)

## 2021-04-12 LAB — URINALYSIS, COMPLETE (UACMP) WITH MICROSCOPIC
Bacteria, UA: NONE SEEN
Bilirubin Urine: NEGATIVE
Glucose, UA: 50 mg/dL — AB
Ketones, ur: NEGATIVE mg/dL
Leukocytes,Ua: NEGATIVE
Nitrite: NEGATIVE
Protein, ur: 100 mg/dL — AB
Specific Gravity, Urine: 1.011 (ref 1.005–1.030)
pH: 6 (ref 5.0–8.0)

## 2021-04-12 MED ORDER — FUROSEMIDE 40 MG PO TABS
40.0000 mg | ORAL_TABLET | Freq: Once | ORAL | Status: AC
  Administered 2021-04-12: 40 mg via ORAL
  Filled 2021-04-12: qty 1

## 2021-04-12 MED ORDER — DIPHENHYDRAMINE HCL 50 MG/ML IJ SOLN
12.5000 mg | Freq: Once | INTRAMUSCULAR | Status: AC
  Administered 2021-04-12: 12.5 mg via INTRAVENOUS
  Filled 2021-04-12: qty 1

## 2021-04-12 MED ORDER — METOCLOPRAMIDE HCL 5 MG/ML IJ SOLN
10.0000 mg | Freq: Once | INTRAMUSCULAR | Status: AC
  Administered 2021-04-12: 10 mg via INTRAVENOUS
  Filled 2021-04-12: qty 2

## 2021-04-12 MED ORDER — HYDRALAZINE HCL 25 MG PO TABS: 25.0000 mg | ORAL_TABLET | Freq: Three times a day (TID) | ORAL | 0 refills | Status: DC

## 2021-04-12 MED ORDER — CLONIDINE HCL 0.1 MG PO TABS: 0.3000 mg | ORAL_TABLET | Freq: Once | ORAL | Status: DC

## 2021-04-12 MED ORDER — ACETAMINOPHEN 500 MG PO TABS
1000.0000 mg | ORAL_TABLET | Freq: Once | ORAL | Status: AC
  Administered 2021-04-12: 1000 mg via ORAL
  Filled 2021-04-12: qty 2

## 2021-04-12 NOTE — ED Notes (Signed)
Patient transported to CT 

## 2021-04-12 NOTE — ED Triage Notes (Signed)
Pt states her b/p has been elevated the past couple days, 200/100's, and has been having a severe HA, denies any other sx. Pt is in NAD, a/ox4, ambulatory with a steady gait

## 2021-04-12 NOTE — Discharge Instructions (Addendum)
Follow-up with your primary care doctor early next week to have your blood pressure rechecked to see if you need to adjust your blood pressure medication further.  Return to the ER for develop chest pain, shortness of breath, worsening headache or any other concerns

## 2021-04-12 NOTE — ED Provider Notes (Signed)
[facility] Emergency Department Provider Note  ____________________________________________   Event Date/Time   First MD Initiated Contact with Patient 04/12/21 1057     (approximate)  I have reviewed the triage vital signs and the nursing notes.   HISTORY  Chief Complaint Hypertension and Headache    HPI Amanda Montgomery is a 65 y.o. female with hypertension, coronary disease status post quadruple bypass back in May, diabetes, renal disorder who comes in with concerns for elevated blood pressure and headache.  Patient reports that her blood pressure has been elevated for the past few days.  She stated that today she developed a headache that was gradually worsening, now more severe, nothing makes it better or worse.  He states that her blood pressures have been in the 200s over 100s.  She states that she is compliant with her blood pressure medication but has not taken her dose of Lasix yet today.  She denies any chest pain, shortness of breath or any other concerns.  Denies any blurry vision with the headache, she does not take anything to help with it better, nothing seems to make it worse.  Patient has not taken her Lasix yesterday, she is on clonidine 0.3 twice daily, metoprolol 12.5.      Past Medical History:  Diagnosis Date   CAD (coronary artery disease)    Diabetes mellitus without complication ([place])    GERD (gastroesophageal reflux disease)    Hypertension    Renal disorder     Patient Active Problem List   Diagnosis Date Noted   Type 2 diabetes mellitus with stage 4 chronic kidney disease ([place]) 08/20/2020   Chest pain 08/19/2020    Past Surgical History:  Procedure Laterality Date   ABDOMINAL HYSTERECTOMY     BACK SURGERY     CARDIAC SURGERY     CARPAL TUNNEL RELEASE     CORONARY ANGIOPLASTY WITH STENT PLACEMENT     REVISION OF SCAR TISSUE RECTUS MUSCLE     TRIGGER FINGER RELEASE      Prior to Admission medications    Medication Sig Start Date End Date Taking? Authorizing Provider  acetaminophen (TYLENOL) 325 MG tablet Take 325-650 mg by mouth every 6 (six) hours as needed for pain.    [provider]  albuterol (VENTOLIN HFA) 108 (90 Base) MCG/ACT inhaler Inhale 2 puffs into the lungs every 4 (four) hours as needed for shortness of breath or wheezing.    [provider]  aspirin 81 MG EC tablet Take 81 mg by mouth daily.    [provider]  chlorthalidone (HYGROTON) 25 MG tablet Take 25 mg by mouth daily.    [provider]  Cholecalciferol 50 MCG (2000 UT) CAPS Take 2,000 Units by mouth daily.    [provider]  cloNIDine (CATAPRES) 0.3 MG tablet Take 1 tablet (0.3 mg total) by mouth 2 (two) times daily. 03/27/21 05/26/21  Ward, Delice Bison, DO  clopidogrel (PLAVIX) 75 MG tablet Take 75 mg by mouth daily.    [provider]  cyclobenzaprine (FLEXERIL) 5 MG tablet Take 5 mg by mouth 3 (three) times daily as needed for muscle spasms.    [provider]  famotidine (PEPCID) 20 MG tablet Take 20 mg by mouth at bedtime.    [provider]  ferrous sulfate 325 (65 FE) MG tablet Take 325 mg by mouth daily with breakfast.    [provider]  furosemide (LASIX) 40 MG tablet Take 40 mg  by mouth 2 (two) times daily.    [provider]  insulin degludec (TRESIBA FLEXTOUCH) 200 UNIT/ML FlexTouch Pen Inject 56 Units into the skin at bedtime.    [provider]  insulin lispro (HUMALOG) 100 UNIT/ML KwikPen Inject 20 Units into the skin 3 (three) times daily with meals. (according to sliding scale)    [provider]  isosorbide mononitrate (IMDUR) 60 MG 24 hr tablet Take 60 mg by mouth daily.    [provider]  levothyroxine (SYNTHROID) 75 MCG tablet Take 75 mcg by mouth daily.    [provider]  lisinopril (ZESTRIL) 20 MG tablet Take 20 mg by mouth daily.    [provider]  nitroGLYCERIN  (NITROSTAT) 0.4 MG SL tablet Place 0.4 mg under the tongue every 5 (five) minutes x 3 doses as needed for chest pain.    [provider]  pantoprazole (PROTONIX) 40 MG tablet Take 40 mg by mouth daily.    [provider]  rosuvastatin (CRESTOR) 10 MG tablet Take 10 mg by mouth at bedtime.    [provider]  senna (SENOKOT) 8.6 MG tablet Take 8.6 mg by mouth every morning.    [provider]  sodium bicarbonate 650 MG tablet Take 650 mg by mouth daily.    [provider]  sucralfate (CARAFATE) 1 g tablet Take 1 g by mouth 4 (four) times daily -  before meals and at bedtime.    [provider]    Allergies Amlodipine, Atorvastatin, Shrimp extract allergy skin test, and Sulfa antibiotics  No family history on file.  Social History Social History   Tobacco Use   Smoking status: Former   Smokeless tobacco: Never  Substance Use Topics   Alcohol use: Not Currently   Drug use: Not Currently      Review of Systems Constitutional: No fever/chills Eyes: No visual changes. ENT: No sore throat. Cardiovascular: Denies chest pain. Respiratory: Denies shortness of breath. Gastrointestinal: No abdominal pain.  No nausea, no vomiting.  No diarrhea.  No constipation. Genitourinary: Negative for dysuria. Musculoskeletal: Negative for back pain. Skin: Negative for rash. Neurological: + headache, no focal weakness or numbness. All other ROS negative ____________________________________________   PHYSICAL EXAM:  VITAL SIGNS: ED Triage Vitals  Enc Vitals Group     BP 04/12/21 0757 (!) 197/59     Pulse Rate 04/12/21 0757 (!) 50     Resp 04/12/21 0757 15     Temp 04/12/21 0759 98.1 F (36.7 C)     Temp Source 04/12/21 0757 Oral     SpO2 04/12/21 0757 100 %     Weight 04/12/21 0759 262 lb (118.8 kg)     Height 04/12/21 0759 5\' 8"  (1.727 m)     Head Circumference --      Peak Flow --      Pain Score 04/12/21 0758 3     Pain Loc --       Pain Edu? --      Excl. in [place]? --     Constitutional: Alert and oriented. Well appearing and in no acute distress. Eyes: Conjunctivae are normal. EOMI. Head: Atraumatic. Nose: No congestion/rhinnorhea. Mouth/Throat: Mucous membranes are moist.   Neck: No stridor. Trachea Midline. FROM Cardiovascular: Normal rate, regular rhythm. Grossly normal heart sounds.  Good peripheral circulation. Respiratory: Normal respiratory effort.  No retractions. Lungs CTAB. Gastrointestinal: Soft and nontender. No distention. No abdominal bruits.  Musculoskeletal: No lower extremity tenderness nor edema.  No  joint effusions. Neurologic:  Normal speech and language. No gross focal neurologic deficits are appreciated.  Equal strength in arms and legs. Skin:  Skin is warm, dry and intact. No rash noted. Psychiatric: Mood and affect are normal. Speech and behavior are normal. GU: Deferred   ____________________________________________   LABS (all labs ordered are listed, but only abnormal results are displayed)  Labs Reviewed  CBC WITH DIFFERENTIAL/PLATELET - Abnormal; Notable for the following components:      Result Value   RBC 3.30 (*)    Hemoglobin 9.8 (*)    HCT 28.6 (*)    All other components within normal limits  COMPREHENSIVE METABOLIC PANEL - Abnormal; Notable for the following components:   Glucose, Bld 136 (*)    BUN 60 (*)    Creatinine, Ser 3.25 (*)    Calcium 8.8 (*)    Albumin 3.1 (*)    GFR, Estimated 15 (*)    All other components within normal limits  URINALYSIS, COMPLETE (UACMP) WITH MICROSCOPIC - Abnormal; Notable for the following components:   Color, Urine STRAW (*)    APPearance CLEAR (*)    Glucose, UA 50 (*)    Hgb urine dipstick SMALL (*)    Protein, ur 100 (*)    All other components within normal limits   ____________________________________________   RADIOLOGY Robert Bellow, personally viewed and evaluated these images (plain radiographs) as part of my  medical decision making, as well as reviewing the written report by the radiologist.  ED MD interpretation: No intracranial hemorrhage  Official radiology report(s): CT HEAD WO CONTRAST (5MM)  Result Date: 04/12/2021 CLINICAL DATA:  Dizziness.  Hypertension EXAM: CT HEAD WITHOUT CONTRAST TECHNIQUE: Contiguous axial images were obtained from the base of the skull through the vertex without intravenous contrast. COMPARISON:  CT head 03/27/2021 FINDINGS: Brain: No evidence of acute infarction, hemorrhage, hydrocephalus, extra-axial collection or mass lesion/mass effect. Vascular: Negative for hyperdense vessel Skull: Negative Sinuses/Orbits: Negative Other: None IMPRESSION: Negative CT head Electronically Signed   By: Franchot Gallo M.D.   On: 04/12/2021 11:58    ____________________________________________   PROCEDURES  Procedure(s) performed (including Critical Care):  Procedures   ____________________________________________   INITIAL IMPRESSION / ASSESSMENT AND PLAN / ED COURSE  Amanda Montgomery was evaluated in Emergency Department on 04/12/2021 for the symptoms described in the history of present illness. She was evaluated in the context of the global COVID-19 pandemic, which necessitated consideration that the patient might be at risk for infection with the SARS-CoV-2 virus that causes COVID-19. Institutional protocols and algorithms that pertain to the evaluation of patients at risk for COVID-19 are in a state of rapid change based on information released by regulatory bodies including the CDC and federal and state organizations. These policies and algorithms were followed during the patient's care in the ED.    Patient comes in with elevated blood pressures.  I reviewed her prior records she normally runs in the 588 systolic.  She is asymptomatic other than having a headache.  Given her blood pressures are that high we will get a CT head to make sure evidence of intercranial  hemorrhage.  However labs were also ordered to make sure no evidence of worsening CKD.  Denies any chest pain or shortness of breath is just ACS.  CT head was negative.  Migraine cocktail was given and patient is feeling much better.  Patient's blood pressure still elevated.  Discussed with nephrology Dr. Holley Raring to see if they  have any other suggestions for blood pressure control.  Recommended starting hydralazine 25 3 times daily.  Discussed this with patient.  She understands that if her kidney function is getting any worse that she may require admission but this time its around her baseline and she already has follow-up in 10 days with her kidney doctor and have it rechecked then.  She understands return precautions in regards to her elevated blood pressure.  Patient feels comfortable being discharged home at this time.  I encouraged her to call her PCP today to get a follow-up on Monday or Tuesday to have her blood pressure rechecked to see if her hydralazine needs to be adjusted  I discussed the provisional nature of ED diagnosis, the treatment so far, the ongoing plan of care, follow up appointments and return precautions with the patient and any family or support people present. They expressed understanding and agreed with the plan, discharged home.          ____________________________________________   FINAL CLINICAL IMPRESSION(S) / ED DIAGNOSES   Final diagnoses:  Hypertension, unspecified type  Other migraine without status migrainosus, intractable  Chronic kidney disease, unspecified CKD stage      MEDICATIONS GIVEN DURING THIS VISIT:  Medications  furosemide (LASIX) tablet 40 mg (has no administration in time range)  cloNIDine (CATAPRES) tablet 0.3 mg (has no administration in time range)  metoCLOPramide (REGLAN) injection 10 mg (has no administration in time range)  diphenhydrAMINE (BENADRYL) injection 12.5 mg (has no administration in time range)  acetaminophen  (TYLENOL) tablet 1,000 mg (has no administration in time range)     ED Discharge Orders     None        Note:  This document was prepared using Dragon voice recognition software and may include unintentional dictation errors.    Vanessa [place], MD 04/12/21 1252

## 2021-07-19 ENCOUNTER — Emergency Department: Payer: 59

## 2021-07-19 ENCOUNTER — Observation Stay: Payer: 59

## 2021-07-19 ENCOUNTER — Other Ambulatory Visit: Payer: Self-pay

## 2021-07-19 ENCOUNTER — Inpatient Hospital Stay
Admission: EM | Admit: 2021-07-19 | Discharge: 2021-07-21 | DRG: 684 | Disposition: A | Discharge status: Home / Self Care | Payer: 59 | Attending: Internal Medicine | Admitting: Internal Medicine

## 2021-07-19 DIAGNOSIS — Z79899 Other long term (current) drug therapy: Secondary | ICD-10-CM

## 2021-07-19 DIAGNOSIS — I160 Hypertensive urgency: Secondary | ICD-10-CM | POA: Diagnosis not present

## 2021-07-19 DIAGNOSIS — N189 Chronic kidney disease, unspecified: Secondary | ICD-10-CM

## 2021-07-19 DIAGNOSIS — Z882 Allergy status to sulfonamides status: Secondary | ICD-10-CM

## 2021-07-19 DIAGNOSIS — Z20822 Contact with and (suspected) exposure to covid-19: Secondary | ICD-10-CM | POA: Diagnosis present

## 2021-07-19 DIAGNOSIS — E785 Hyperlipidemia, unspecified: Secondary | ICD-10-CM | POA: Diagnosis present

## 2021-07-19 DIAGNOSIS — I129 Hypertensive chronic kidney disease with stage 1 through stage 4 chronic kidney disease, or unspecified chronic kidney disease: Secondary | ICD-10-CM | POA: Diagnosis present

## 2021-07-19 DIAGNOSIS — R079 Chest pain, unspecified: Secondary | ICD-10-CM | POA: Diagnosis present

## 2021-07-19 DIAGNOSIS — Z955 Presence of coronary angioplasty implant and graft: Secondary | ICD-10-CM

## 2021-07-19 DIAGNOSIS — I161 Hypertensive emergency: Secondary | ICD-10-CM | POA: Diagnosis not present

## 2021-07-19 DIAGNOSIS — Z888 Allergy status to other drugs, medicaments and biological substances status: Secondary | ICD-10-CM

## 2021-07-19 DIAGNOSIS — E1122 Type 2 diabetes mellitus with diabetic chronic kidney disease: Secondary | ICD-10-CM | POA: Diagnosis present

## 2021-07-19 DIAGNOSIS — D509 Iron deficiency anemia, unspecified: Secondary | ICD-10-CM | POA: Diagnosis present

## 2021-07-19 DIAGNOSIS — Z79890 Hormone replacement therapy (postmenopausal): Secondary | ICD-10-CM

## 2021-07-19 DIAGNOSIS — I2510 Atherosclerotic heart disease of native coronary artery without angina pectoris: Secondary | ICD-10-CM | POA: Diagnosis present

## 2021-07-19 DIAGNOSIS — G4733 Obstructive sleep apnea (adult) (pediatric): Secondary | ICD-10-CM

## 2021-07-19 DIAGNOSIS — N179 Acute kidney failure, unspecified: Secondary | ICD-10-CM | POA: Diagnosis present

## 2021-07-19 DIAGNOSIS — N170 Acute kidney failure with tubular necrosis: Secondary | ICD-10-CM | POA: Diagnosis not present

## 2021-07-19 DIAGNOSIS — E039 Hypothyroidism, unspecified: Secondary | ICD-10-CM | POA: Diagnosis present

## 2021-07-19 DIAGNOSIS — M7989 Other specified soft tissue disorders: Secondary | ICD-10-CM | POA: Diagnosis present

## 2021-07-19 DIAGNOSIS — N2581 Secondary hyperparathyroidism of renal origin: Secondary | ICD-10-CM | POA: Diagnosis present

## 2021-07-19 DIAGNOSIS — M79604 Pain in right leg: Secondary | ICD-10-CM | POA: Diagnosis present

## 2021-07-19 DIAGNOSIS — Z91013 Allergy to seafood: Secondary | ICD-10-CM

## 2021-07-19 DIAGNOSIS — E669 Obesity, unspecified: Secondary | ICD-10-CM | POA: Diagnosis present

## 2021-07-19 DIAGNOSIS — Z9989 Dependence on other enabling machines and devices: Secondary | ICD-10-CM

## 2021-07-19 DIAGNOSIS — Z794 Long term (current) use of insulin: Secondary | ICD-10-CM

## 2021-07-19 DIAGNOSIS — M79605 Pain in left leg: Secondary | ICD-10-CM | POA: Diagnosis present

## 2021-07-19 DIAGNOSIS — R0609 Other forms of dyspnea: Secondary | ICD-10-CM | POA: Diagnosis present

## 2021-07-19 DIAGNOSIS — Z7982 Long term (current) use of aspirin: Secondary | ICD-10-CM

## 2021-07-19 DIAGNOSIS — Z87891 Personal history of nicotine dependence: Secondary | ICD-10-CM

## 2021-07-19 DIAGNOSIS — N184 Chronic kidney disease, stage 4 (severe): Secondary | ICD-10-CM

## 2021-07-19 DIAGNOSIS — D631 Anemia in chronic kidney disease: Secondary | ICD-10-CM | POA: Diagnosis present

## 2021-07-19 DIAGNOSIS — Z6839 Body mass index (BMI) 39.0-39.9, adult: Secondary | ICD-10-CM

## 2021-07-19 DIAGNOSIS — K219 Gastro-esophageal reflux disease without esophagitis: Secondary | ICD-10-CM | POA: Diagnosis present

## 2021-07-19 LAB — D-DIMER, QUANTITATIVE: D-Dimer, Quant: 1.19 ug/mL-FEU — ABNORMAL HIGH (ref 0.00–0.50)

## 2021-07-19 LAB — COMPREHENSIVE METABOLIC PANEL
ALT: 27 U/L (ref 0–44)
AST: 23 U/L (ref 15–41)
Albumin: 2.9 g/dL — ABNORMAL LOW (ref 3.5–5.0)
Alkaline Phosphatase: 72 U/L (ref 38–126)
Anion gap: 9 (ref 5–15)
BUN: 57 mg/dL — ABNORMAL HIGH (ref 8–23)
CO2: 22 mmol/L (ref 22–32)
Calcium: 8.5 mg/dL — ABNORMAL LOW (ref 8.9–10.3)
Chloride: 107 mmol/L (ref 98–111)
Creatinine, Ser: 4.31 mg/dL — ABNORMAL HIGH (ref 0.44–1.00)
GFR, Estimated: 11 mL/min — ABNORMAL LOW (ref >60)
Glucose, Bld: 145 mg/dL — ABNORMAL HIGH (ref 70–99)
Potassium: 4.9 mmol/L (ref 3.5–5.1)
Sodium: 138 mmol/L (ref 135–145)
Total Bilirubin: 0.8 mg/dL (ref 0.3–1.2)
Total Protein: 6.9 g/dL (ref 6.5–8.1)

## 2021-07-19 LAB — CBG MONITORING, ED: Glucose-Capillary: 131 mg/dL — ABNORMAL HIGH (ref 70–99)

## 2021-07-19 LAB — CBC
HCT: 28.7 % — ABNORMAL LOW (ref 36.0–46.0)
Hemoglobin: 9.4 g/dL — ABNORMAL LOW (ref 12.0–15.0)
MCH: 29.3 pg (ref 26.0–34.0)
MCHC: 32.8 g/dL (ref 30.0–36.0)
MCV: 89.4 fL (ref 80.0–100.0)
Platelets: 204 10*3/uL (ref 150–400)
RBC: 3.21 MIL/uL — ABNORMAL LOW (ref 3.87–5.11)
RDW: 12.5 % (ref 11.5–15.5)
WBC: 6.7 10*3/uL (ref 4.0–10.5)
nRBC: 0 % (ref 0.0–0.2)

## 2021-07-19 LAB — RESP PANEL BY RT-PCR (FLU A&B, COVID) ARPGX2
Influenza A by PCR: NEGATIVE
Influenza B by PCR: NEGATIVE
SARS Coronavirus 2 by RT PCR: NEGATIVE

## 2021-07-19 LAB — TROPONIN I (HIGH SENSITIVITY)
Troponin I (High Sensitivity): 14 ng/L (ref <18)
Troponin I (High Sensitivity): 14 ng/L (ref <18)

## 2021-07-19 MED ORDER — NITROGLYCERIN 0.4 MG SL SUBL: 0.4000 mg | SUBLINGUAL_TABLET | SUBLINGUAL | Status: DC | PRN

## 2021-07-19 MED ORDER — INSULIN GLARGINE-YFGN 100 UNIT/ML ~~LOC~~ SOLN
16.0000 [IU] | Freq: Every day | SUBCUTANEOUS | Status: DC
  Administered 2021-07-20: 16 [IU] via SUBCUTANEOUS
  Filled 2021-07-19 (×2): qty 0.16

## 2021-07-19 MED ORDER — ONDANSETRON HCL 4 MG PO TABS: 4.0000 mg | ORAL_TABLET | Freq: Four times a day (QID) | ORAL | Status: DC | PRN

## 2021-07-19 MED ORDER — ALBUTEROL SULFATE (2.5 MG/3ML) 0.083% IN NEBU: 2.5000 mg | INHALATION_SOLUTION | RESPIRATORY_TRACT | Status: DC | PRN

## 2021-07-19 MED ORDER — LORAZEPAM 2 MG/ML IJ SOLN
0.5000 mg | Freq: Once | INTRAMUSCULAR | Status: AC
  Administered 2021-07-19: 0.5 mg via INTRAVENOUS
  Filled 2021-07-19: qty 1

## 2021-07-19 MED ORDER — LABETALOL HCL 5 MG/ML IV SOLN
20.0000 mg | Freq: Once | INTRAVENOUS | Status: DC
  Filled 2021-07-19: qty 4

## 2021-07-19 MED ORDER — ONDANSETRON HCL 4 MG/2ML IJ SOLN: 4.0000 mg | Freq: Four times a day (QID) | INTRAMUSCULAR | Status: DC | PRN

## 2021-07-19 MED ORDER — ACETAMINOPHEN 325 MG PO TABS
650.0000 mg | ORAL_TABLET | Freq: Four times a day (QID) | ORAL | Status: DC | PRN
  Administered 2021-07-20 (×2): 650 mg via ORAL
  Filled 2021-07-19 (×2): qty 2

## 2021-07-19 MED ORDER — HYDRALAZINE HCL 50 MG PO TABS
25.0000 mg | ORAL_TABLET | Freq: Three times a day (TID) | ORAL | Status: DC
  Administered 2021-07-19: 25 mg via ORAL
  Filled 2021-07-19: qty 1

## 2021-07-19 MED ORDER — ISOSORBIDE MONONITRATE ER 60 MG PO TB24
60.0000 mg | ORAL_TABLET | Freq: Every day | ORAL | Status: DC
  Administered 2021-07-20 – 2021-07-21 (×2): 60 mg via ORAL
  Filled 2021-07-19 (×2): qty 1

## 2021-07-19 MED ORDER — FUROSEMIDE 10 MG/ML IJ SOLN
40.0000 mg | Freq: Once | INTRAMUSCULAR | Status: AC
  Administered 2021-07-20: 40 mg via INTRAVENOUS
  Filled 2021-07-19: qty 4

## 2021-07-19 MED ORDER — INSULIN ASPART 100 UNIT/ML IJ SOLN
0.0000 [IU] | Freq: Three times a day (TID) | INTRAMUSCULAR | Status: DC
  Administered 2021-07-20 (×2): 4 [IU] via SUBCUTANEOUS
  Administered 2021-07-20: 7 [IU] via SUBCUTANEOUS
  Administered 2021-07-21: 3 [IU] via SUBCUTANEOUS
  Administered 2021-07-21: 4 [IU] via SUBCUTANEOUS
  Filled 2021-07-19 (×5): qty 1

## 2021-07-19 MED ORDER — VITAMIN D3 25 MCG (1000 UNIT) PO TABS
2000.0000 [IU] | ORAL_TABLET | Freq: Every day | ORAL | Status: DC
  Administered 2021-07-20 – 2021-07-21 (×2): 2000 [IU] via ORAL
  Filled 2021-07-19 (×4): qty 2

## 2021-07-19 MED ORDER — LEVOTHYROXINE SODIUM 50 MCG PO TABS
75.0000 ug | ORAL_TABLET | Freq: Every day | ORAL | Status: DC
  Administered 2021-07-20 – 2021-07-21 (×2): 75 ug via ORAL
  Filled 2021-07-19 (×2): qty 2

## 2021-07-19 MED ORDER — NICARDIPINE HCL IN NACL 20-0.86 MG/200ML-% IV SOLN
3.0000 mg/h | INTRAVENOUS | Status: DC
  Administered 2021-07-19: 5 mg/h via INTRAVENOUS
  Administered 2021-07-20: 3 mg/h via INTRAVENOUS
  Filled 2021-07-19 (×2): qty 200

## 2021-07-19 MED ORDER — METOPROLOL TARTRATE 25 MG PO TABS
12.5000 mg | ORAL_TABLET | Freq: Every day | ORAL | Status: DC
  Administered 2021-07-20 – 2021-07-21 (×2): 12.5 mg via ORAL
  Filled 2021-07-19 (×2): qty 1

## 2021-07-19 MED ORDER — FAMOTIDINE 20 MG PO TABS
20.0000 mg | ORAL_TABLET | Freq: Every day | ORAL | Status: DC
  Administered 2021-07-19 – 2021-07-20 (×2): 20 mg via ORAL
  Filled 2021-07-19 (×2): qty 1

## 2021-07-19 MED ORDER — GABAPENTIN 100 MG PO CAPS
100.0000 mg | ORAL_CAPSULE | Freq: Three times a day (TID) | ORAL | Status: DC
  Administered 2021-07-20 – 2021-07-21 (×4): 100 mg via ORAL
  Filled 2021-07-19 (×4): qty 1

## 2021-07-19 MED ORDER — HYDRALAZINE HCL 20 MG/ML IJ SOLN
20.0000 mg | Freq: Once | INTRAMUSCULAR | Status: AC
  Administered 2021-07-19: 20 mg via INTRAVENOUS
  Filled 2021-07-19: qty 1

## 2021-07-19 MED ORDER — OXYMETAZOLINE HCL 0.05 % NA SOLN
1.0000 | Freq: Once | NASAL | Status: AC
  Administered 2021-07-19: 1 via NASAL
  Filled 2021-07-19: qty 30

## 2021-07-19 MED ORDER — ALBUTEROL SULFATE HFA 108 (90 BASE) MCG/ACT IN AERS: 2.0000 | INHALATION_SPRAY | RESPIRATORY_TRACT | Status: DC | PRN

## 2021-07-19 MED ORDER — CLONIDINE HCL 0.1 MG PO TABS
0.3000 mg | ORAL_TABLET | Freq: Two times a day (BID) | ORAL | Status: DC
  Administered 2021-07-19 – 2021-07-21 (×4): 0.3 mg via ORAL
  Filled 2021-07-19 (×4): qty 3

## 2021-07-19 MED ORDER — INSULIN ASPART 100 UNIT/ML IJ SOLN: 0.0000 [IU] | Freq: Every day | INTRAMUSCULAR | Status: DC

## 2021-07-19 MED ORDER — NITROGLYCERIN IN D5W 200-5 MCG/ML-% IV SOLN
0.0000 ug/min | INTRAVENOUS | Status: DC
  Administered 2021-07-19: 5 ug/min via INTRAVENOUS
  Filled 2021-07-19: qty 250

## 2021-07-19 MED ORDER — FERROUS SULFATE 325 (65 FE) MG PO TABS
325.0000 mg | ORAL_TABLET | Freq: Every day | ORAL | Status: DC
  Administered 2021-07-20 – 2021-07-21 (×2): 325 mg via ORAL
  Filled 2021-07-19 (×2): qty 1

## 2021-07-19 MED ORDER — HEPARIN SODIUM (PORCINE) 5000 UNIT/ML IJ SOLN
5000.0000 [IU] | Freq: Three times a day (TID) | INTRAMUSCULAR | Status: DC
  Administered 2021-07-19 – 2021-07-21 (×5): 5000 [IU] via SUBCUTANEOUS
  Filled 2021-07-19 (×5): qty 1

## 2021-07-19 MED ORDER — ACETAMINOPHEN 325 MG RE SUPP: 650.0000 mg | Freq: Four times a day (QID) | RECTAL | Status: DC | PRN

## 2021-07-19 NOTE — Assessment & Plan Note (Addendum)
-   Resumed home antihypertensives including clonidine 0.3 mg p.o. twice daily, hydralazine 25 mg p.o. 3 times daily, isosorbide mononitrate 60 mg daily, metoprolol tartrate 12.5 mg p.o. twice daily - Discontinued nitroglycerin gtt. - Ordered Cardene gtt., with goal blood pressure of 180-190 for first 4 hours and can have goal of 150-170 after first hour hours - Ordered CT of the head without contrast - Admit to progressive cardiac, observation, telemetry

## 2021-07-19 NOTE — ED Triage Notes (Signed)
Pt reports centralized chest pain earlier, has since resolved. Also reports weird feeling in right ear, "like sounds are magnified".  +nausea, shob.  NAD noted .

## 2021-07-19 NOTE — Hospital Course (Addendum)
Ms. Kayana Thoen is a 66 year old female with history of hypertension, hyperlipidemia, insulin-dependent diabetes mellitus, hypothyroid, GERD, neuropathy, iron deficiency anemia, who presents to the emergency department for chief concerns of chest pain.  Initial vitals in the emergency department showed temperature 98.1, respiration rate of 20, heart rate of 62, blood pressure 235 over 98, improved to 192/92, respiration rate with 99% on room air.  Labs in the ED showed serum sodium 138, potassium 4.9, chloride 107, bicarb 22, BUN of 57, serum creatinine of 4.31, GFR of 11, nonfasting blood glucose 145, WBC was 6.7, hemoglobin 9.4, platelets of 204.  Troponin high-sensitivity x2 was 14.  D-dimer was elevated at 1.19.  COVID/influenza A/influenza B PCR were negative.  In the emergency department patient received Ativan 0.5 mg IV., labetalol 20 mg IV, hydralazine 20 mg IV.  Patient was started on nitroglycerin gtt.

## 2021-07-19 NOTE — ED Provider Triage Note (Signed)
Emergency Medicine Provider Triage Evaluation Note  Amanda Montgomery , a 65 y.o. female  was evaluated in triage.  Pt complains of 3 days of chest pain.  She describes some nausea, mild dizziness and a weird feeling of pressure in the right ear.  She denies any diaphoresis, vomiting or diarrhea.  No fevers or chills.  Chest pain is constant, sharp and not worse with eating  Review of Systems  Positive: Chest pain Negative: Abdominal pain shortness of breath fevers  Physical Exam  Pulse 62    Temp 98.1 F (36.7 C)    Resp 20    Ht 5\' 9"  (1.753 m)    Wt 122.5 kg    SpO2 99%    BMI 39.87 kg/m  Gen:   Awake, no distress   Resp:  Normal effort  MSK:   Moves extremities without difficulty  Other:    Medical Decision Making  Medically screening exam initiated at 6:36 PM.  Appropriate orders placed.  Amanda Montgomery was informed that the remainder of the evaluation will be completed by another provider, this initial triage assessment does not replace that evaluation, and the importance of remaining in the ED until their evaluation is complete.     Duanne Guess, [place] 07/19/21 1837

## 2021-07-19 NOTE — Assessment & Plan Note (Signed)
-   Continue nitroglycerin gtt.

## 2021-07-19 NOTE — Assessment & Plan Note (Signed)
-   Insulin-dependent - Resumed home long-acting insulin degludec Tyler Aas) 16 units subcutaneous nightly - Insulin SSI, obese/resistant dosing, with at bedtime coverage

## 2021-07-19 NOTE — ED Provider Notes (Signed)
[facility] Provider Note    Event Date/Time   First MD Initiated Contact with Patient 07/19/21 1848     (approximate)  History   Chest Pain  HPI MISHKA STEGEMANN is a 66 y.o. female who presents for intermittent chest pain and fluttering over the last 2 days.  Patient is also noticed that her blood pressure has been from her baseline despite taking her normally prescribed medication today.  Patient also endorses associated shortness of breath that is slightly worsened on exertion.  Patient describes this pain as a pressure/fullness that radiates through to her back and has almost fully resolved at the time of this evaluation but states that it comes and goes.     Physical Exam   Triage Vital Signs: ED Triage Vitals  Enc Vitals Group     BP 07/19/21 1834 (!) 235/98     Pulse Rate 07/19/21 1832 62     Resp 07/19/21 1832 20     Temp 07/19/21 1832 98.1 F (36.7 C)     Temp src --      SpO2 07/19/21 1832 99 %     Weight 07/19/21 1832 270 lb (122.5 kg)     Height 07/19/21 1832 5\' 9"  (1.753 m)     Head Circumference --      Peak Flow --      Pain Score 07/19/21 1832 0     Pain Loc --      Pain Edu? --      Excl. in [place]? --     Most recent vital signs: Vitals:   07/19/21 2230 07/19/21 2302  BP: (!) 184/73 (!) 247/93  Pulse: 70 74  Resp: 20 16  Temp:    SpO2: 100% 100%    General: Awake, oriented x4 CV:  Good peripheral perfusion.  Resp:  Normal effort.  Abd:  No distention.  Other:  Elderly African-American female laying in bed in no distress with well-healed sternotomy scar   ED Results / Procedures / Treatments   Labs (all labs ordered are listed, but only abnormal results are displayed) Labs Reviewed  CBC - Abnormal; Notable for the following components:      Result Value   RBC 3.21 (*)    Hemoglobin 9.4 (*)    HCT 28.7 (*)    All other components within normal limits  D-DIMER, QUANTITATIVE - Abnormal; Notable for the following  components:   D-Dimer, Quant 1.19 (*)    All other components within normal limits  COMPREHENSIVE METABOLIC PANEL - Abnormal; Notable for the following components:   Glucose, Bld 145 (*)    BUN 57 (*)    Creatinine, Ser 4.31 (*)    Calcium 8.5 (*)    Albumin 2.9 (*)    GFR, Estimated 11 (*)    All other components within normal limits  RESP PANEL BY RT-PCR (FLU A&B, COVID) [id number]  BASIC METABOLIC PANEL  CBC  HEMOGLOBIN A1C  TROPONIN I (HIGH SENSITIVITY)  TROPONIN I (HIGH SENSITIVITY)   EKG ED ECG REPORT I, Naaman Plummer, the attending physician, personally viewed and interpreted this ECG.  Date: 07/19/2021 EKG Time: 1837 Rate: 67 Rhythm: sinus rhythm w/ PACs QRS Axis: normal Intervals: normal ST/T Wave abnormalities: normal Narrative Interpretation: Sinus rhythm w/ PACs.  No evidence of acute ischemia   RADIOLOGY ED MD interpretation: 2 view chest x-ray shows no evidence of acute abnormalities including no pneumonia, pneumothorax, or widened mediastinum.  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 Cardiac silhouette is unremarkable. No acute airspace disease, effusion, or pneumothorax. There are no acute bony abnormalities. IMPRESSION: 1. No acute intrathoracic process. Electronically Signed   By: Randa Ngo M.D.   On: 07/19/2021 18:56   CT HEAD WO CONTRAST (5MM)  Result Date: 07/19/2021 CLINICAL DATA:  Hypertensive emergency hypertensive urgency, sbp 235 EXAM: CT HEAD WITHOUT CONTRAST TECHNIQUE: Contiguous axial images were obtained from the base of the skull through the vertex without intravenous contrast. RADIATION DOSE REDUCTION: This exam was performed according to the departmental  dose-optimization program which includes automated exposure control, adjustment of the mA and/or kV according to patient size and/or use of iterative reconstruction technique. COMPARISON:  CT head 04/12/2021 FINDINGS: Brain: No evidence of large-territorial acute infarction. No parenchymal hemorrhage. No mass lesion. No extra-axial collection. No mass effect or midline shift. No hydrocephalus. Basilar cisterns are patent. Vascular: No hyperdense vessel. Atherosclerotic calcifications are present within the cavernous internal carotid arteries. Skull: No acute fracture or focal lesion. Sinuses/Orbits: Paranasal sinuses and mastoid air cells are clear. The orbits are unremarkable. Other: None. IMPRESSION: No acute intracranial abnormality. Electronically Signed   By: Iven Finn M.D.   On: 07/19/2021 22:49   CT CHEST ABDOMEN PELVIS WO CONTRAST  Result Date: 07/19/2021 CLINICAL DATA:  Chest pain for 3 days, nausea, mild dizziness EXAM: CT CHEST, ABDOMEN AND PELVIS WITHOUT CONTRAST TECHNIQUE: Multidetector CT imaging of the chest, abdomen and pelvis was performed following the standard protocol without IV contrast. Evaluation of the vascular structures as well as the abdominopelvic viscera is severely limited without IV contrast. RADIATION DOSE REDUCTION: This exam was performed according to the departmental dose-optimization program which includes automated exposure control, adjustment of the mA and/or kV according to patient size and/or use of iterative reconstruction technique. 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 Limited unenhanced imaging of the heart and great vessels demonstrates no pericardial fluid. Normal caliber of the thoracic aorta, with mild atherosclerosis of the aortic arch. Evaluation of the vascular lumen is limited without IV contrast. Mediastinum/Nodes: No enlarged mediastinal, hilar, or axillary lymph  nodes. Thyroid gland, trachea, and esophagus demonstrate no significant findings. Lungs/Pleura: Scattered areas of ground-glass airspace disease are seen within the lower lung zones, greatest in the lower lobes. The appearance is most suggestive of nonspecific inflammation or infection, including atypical infection such as COVID-19. No effusion or pneumothorax. 5 mm right middle lobe pulmonary nodule reference image 67/4. No other pulmonary nodules or masses. Central airways are widely patent. Musculoskeletal: No acute or destructive bony lesions. Reconstructed images demonstrate no additional findings. CT ABDOMEN PELVIS FINDINGS Hepatobiliary: Unremarkable unenhanced appearance of the liver and gallbladder. Pancreas: Unremarkable unenhanced appearance. Spleen: Unremarkable unenhanced appearance. Adrenals/Urinary Tract: No urinary tract calculi or obstructive uropathy within either kidney. 2.4 cm cyst lower pole right kidney. The adrenals are unremarkable. The bladder is decompressed, limiting its evaluation. Stomach/Bowel: No bowel obstruction or ileus. Normal appendix right lower quadrant. No bowel wall thickening or inflammatory change. Vascular/Lymphatic: Evaluation of the vascular lumen is limited without IV contrast. Extensive atherosclerosis of the aorta and bilateral iliac vessels. No pathologic adenopathy within the abdomen or pelvis. Reproductive: Status post hysterectomy. No adnexal masses. Other: No free fluid  or free gas.  No abdominal wall hernia. Musculoskeletal: No acute or destructive bony lesions. Reconstructed images demonstrate no additional findings. IMPRESSION: 1. Diffuse aortic atherosclerosis. Evaluation of the vascular lumen is limited without IV contrast. 2. Scattered bibasilar ground-glass airspace disease consistent with nonspecific infection or inflammation. Atypical infection such as COVID-19 can give this appearance. 3. 5 mm right solid pulmonary nodule. No routine follow-up imaging is  recommended per Fleischner Society Guidelines. These guidelines do not apply to immunocompromised patients and patients with cancer. Follow up in patients with significant comorbidities as clinically warranted. For lung cancer screening, adhere to Lung-RADS guidelines. Reference: Radiology. 2017; 284(1):228-43. 4. No acute intra-abdominal or intrapelvic process. Electronically Signed   By: Randa Ngo M.D.   On: 07/19/2021 20:52      PROCEDURES:  Critical Care performed: Yes, see critical care procedure note(s)  .1-3 Lead EKG Interpretation Performed by: Naaman Plummer, MD Authorized by: Naaman Plummer, MD     Interpretation: normal     ECG rate:  74   ECG rate assessment: normal     Rhythm: sinus rhythm     Ectopy: none     Conduction: normal    CRITICAL CARE Performed by: Naaman Plummer   Total critical care time: 33 minutes  Critical care time was exclusive of separately billable procedures and treating other patients.  Critical care was necessary to treat or prevent imminent or life-threatening deterioration.  Critical care was time spent personally by me on the following activities: development of treatment plan with patient and/or surrogate as well as nursing, discussions with consultants, evaluation of patient's response to treatment, examination of patient, obtaining history from patient or surrogate, ordering and performing treatments and interventions, ordering and review of laboratory studies, ordering and review of radiographic studies, pulse oximetry and re-evaluation of patient's condition.   MEDICATIONS ORDERED IN ED: Medications  labetalol (NORMODYNE) injection 20 mg (0 mg Intravenous Hold 07/19/21 1937)  cloNIDine (CATAPRES) tablet 0.3 mg (has no administration in time range)  hydrALAZINE (APRESOLINE) tablet 25 mg (has no administration in time range)  isosorbide mononitrate (IMDUR) 24 hr tablet 60 mg (has no administration in time range)  metoprolol  tartrate (LOPRESSOR) tablet 12.5 mg (has no administration in time range)  nitroGLYCERIN (NITROSTAT) SL tablet 0.4 mg (has no administration in time range)  insulin glargine-yfgn (SEMGLEE) injection 16 Units (has no administration in time range)  levothyroxine (SYNTHROID) tablet 75 mcg (has no administration in time range)  famotidine (PEPCID) tablet 20 mg (has no administration in time range)  ferrous sulfate tablet 325 mg (has no administration in time range)  gabapentin (NEURONTIN) capsule 100 mg (has no administration in time range)  cholecalciferol (VITAMIN D) tablet 2,000 Units (has no administration in time range)  acetaminophen (TYLENOL) tablet 650 mg (has no administration in time range)    Or  acetaminophen (TYLENOL) suppository 650 mg (has no administration in time range)  ondansetron (ZOFRAN) tablet 4 mg (has no administration in time range)    Or  ondansetron (ZOFRAN) injection 4 mg (has no administration in time range)  heparin injection 5,000 Units (has no administration in time range)  insulin aspart (novoLOG) injection 0-20 Units (has no administration in time range)  insulin aspart (novoLOG) injection 0-5 Units (has no administration in time range)  albuterol (PROVENTIL) (2.5 MG/3ML) 0.083% nebulizer solution 2.5 mg (has no administration in time range)  nicardipine (CARDENE) 20mg  in 0.86% saline 222ml IV infusion (0.1 mg/ml) (has no administration in time  range)  hydrALAZINE (APRESOLINE) injection 20 mg (20 mg Intravenous Given 07/19/21 1937)  LORazepam (ATIVAN) injection 0.5 mg (0.5 mg Intravenous Given 07/19/21 2142)  oxymetazoline (AFRIN) 0.05 % nasal spray 1 spray (1 spray Each Nare Given 07/19/21 2143)     IMPRESSION / MDM / ASSESSMENT AND PLAN / ED COURSE  I reviewed the triage vital signs and the nursing notes.                              Differential diagnosis includes, but is not limited to, hypertensive urgency, aortic dissection, ACS, acute renal  failure  The patient is on the cardiac monitor to evaluate for evidence of arrhythmia and/or significant heart rate changes.  Patient is a 66 year old female with the above-stated past medical history and HPI.  Patient initially found to be significantly hypertensive with systolics in the 960 range and therefore initially given 20 mg of hydralazine with initial mild improvement in patient's systolic blood pressure into the 190s however recurrence almost immediately after 1 hour.  Therefore patient was started on a nitroglycerin drip with improvement of her symptoms as well as her blood pressure. Laboratory evaluation significant for D-dimer elevation of 1.19 CBC with hemoglobin/hematocrit 9.4/28 looks to be at her baseline of approximately 10 CMP concerning for worsening creatinine to 4.31 from 3.253 months ago To be chest x-ray shows no evidence of acute abnormalities  Given patient's persistent hypertension and needing nitroglycerin drip, she will require admission to the internal medicine service for further evaluation and management.  Additionally patient does seem to have acute kidney injury from this hypertensive crisis.  Dispo: Admit to medicine        FINAL CLINICAL IMPRESSION(S) / ED DIAGNOSES   Final diagnoses:  Hypertensive urgency  Acute renal failure superimposed on chronic kidney disease, unspecified CKD stage, unspecified acute renal failure type ([place])     Rx / DC Orders   ED Discharge Orders     None        Note:  This document was prepared using Dragon voice recognition software and may include unintentional dictation errors.   Naaman Plummer, MD 07/19/21 [phone]

## 2021-07-19 NOTE — Assessment & Plan Note (Signed)
-   Left greater than the right - With bilateral lower extremity pain - Ultrasound of the lower extremity ordered to assess for DVT

## 2021-07-19 NOTE — Assessment & Plan Note (Addendum)
-   Differentials include new onset heart failure exacerbation - Check stat BNP, furosemide 40 mg IV one-time dose ordered - Complete echo ordered - Strict I's and O's

## 2021-07-19 NOTE — Assessment & Plan Note (Addendum)
-   Presumed secondary to cardiorenal - Furosemide 40 mg IV one-time dose - BMP in the a.m.

## 2021-07-19 NOTE — H&P (Addendum)
History and Physical   Amanda Montgomery TIW:[id number] DOB: 03/28/1956 DOA: 07/19/2021  PCP: Merryl Hacker, No  Outpatient Specialists: Dr. Lamonte Sakai, nephrology Patient coming from: home  I have personally briefly reviewed patient's old medical records in [place].  Chief Concern: Chest pain  HPI: [name] is a 66 year old female with history of hypertension, hyperlipidemia, insulin-dependent diabetes mellitus, hypothyroid, GERD, neuropathy, iron deficiency anemia, who presents to the emergency department for chief concerns of chest pain.  Initial vitals in the emergency department showed temperature 98.1, respiration rate of 20, heart rate of 62, blood pressure 235 over 98, improved to 192/92, respiration rate with 99% on room air.  Labs in the ED showed serum sodium 138, potassium 4.9, chloride 107, bicarb 22, BUN of 57, serum creatinine of 4.31, GFR of 11, nonfasting blood glucose 145, WBC was 6.7, hemoglobin 9.4, platelets of 204.  Troponin high-sensitivity x2 was 14.  D-dimer was elevated at 1.19.  COVID/influenza A/influenza B PCR were negative.  In the emergency department patient received Ativan 0.5 mg IV., labetalol 20 mg IV, hydralazine 20 mg IV.  Patient was started on nitroglycerin gtt.  At bedside, she was able to tell me her name, age, current location, and her sister at bedside.   She states that she developed gas pressure, 5/10, constant, started about three days ago. She endorses new shortness of breath and lightheadness that started three days ago as well.   She states it is difficult to breath, new shortness of breath with walking up stairs. She reports new swelling in her legs that has not gone away. She states the swelling is new because it did not go away with sleeping. She has gained 15 pounds in about 10 months that is unintentional.  She reports she is never felt this way before.  She sleeps with two pillows and her cpap at night and this has not  changed.   Social history: She lives in [place], [place] and currently visiting niece in [place]. She is a former tobacco user, quitting 2009. At her peak, she smoked 1/2 ppd. She denies etoh. She denies history of drug use.   Vaccination history: She is vaccinated for influenza and covid.   ROS: Constitutional: + weight change, no fever ENT/Mouth: no sore throat, no rhinorrhea Eyes: no eye pain, no vision changes Cardiovascular: no chest pain, no dyspnea,  no edema, no palpitations Respiratory: no cough, no sputum, no wheezing Gastrointestinal: no nausea, no vomiting, no diarrhea, no constipation Genitourinary: no urinary incontinence, no dysuria, no hematuria Musculoskeletal: no arthralgias, no myalgias Skin: no skin lesions, no pruritus, Neuro: + weakness, no loss of consciousness, no syncope Psych: no anxiety, no depression, + decrease appetite Heme/Lymph: no bruising, no bleeding  ED Course: Discussed with emergency medicine provider, patient current hospitalization for chief concerns of hypertensive urgency.  [name] is a 66 year old female with history of hypertension, hyperlipidemia, insulin-dependent diabetes mellitus, CKD stage IV  Assessment/Plan  Principal Problem:   Hypertensive urgency Active Problems:   Chest pain   Type 2 diabetes mellitus with stage 4 chronic kidney disease (HCC)   CKD (chronic kidney disease) stage 4, GFR 15-29 ml/min (HCC)   AKI (acute kidney injury) (HCC)   Dyspnea on exertion   Swelling of lower extremity   OSA on CPAP    Cardiovascular and Mediastinum * Hypertensive urgency Assessment & Plan - Resumed home antihypertensives including clonidine 0.3 mg p.o. twice daily, hydralazine 25 mg p.o. 3 times daily, isosorbide mononitrate 60  mg daily, metoprolol tartrate 12.5 mg p.o. twice daily - Discontinued nitroglycerin gtt. - Ordered Cardene gtt., with goal blood pressure of 180-190 for first 4 hours and can have goal of 150-170  after first hour hours - Ordered CT of the head without contrast - Admit to progressive cardiac, observation, telemetry  Endocrine Type 2 diabetes mellitus with stage 4 chronic kidney disease (HCC) Assessment & Plan - Insulin-dependent - Resumed home long-acting insulin degludec Tyler Aas) 16 units subcutaneous nightly - Insulin SSI, obese/resistant dosing, with at bedtime coverage   Genitourinary AKI (acute kidney injury) ([place]) Assessment & Plan - Presumed secondary to cardiorenal - Furosemide 40 mg IV one-time dose - BMP in the a.m.  Other Swelling of lower extremity Assessment & Plan - Left greater than the right - With bilateral lower extremity pain - Ultrasound of the lower extremity ordered to assess for DVT  Dyspnea on exertion Assessment & Plan - Differentials include new onset heart failure exacerbation - Check stat BNP, furosemide 40 mg IV one-time dose ordered - Complete echo ordered - Strict I's and O's  Chest pain Assessment & Plan - Continue nitroglycerin gtt.  Chart reviewed.   DVT prophylaxis: Heparin 5000 units subcutaneous every 8 hours Code Status: Full code Diet: Heart healthy/carb modified Family Communication: updated sister at bedside with patient's permission Disposition Plan: Pending clinical course Consults called: None at this time Admission status: Progressive cardiac, observation, telemetry  Past Medical History:  Diagnosis Date   CAD (coronary artery disease)    Diabetes mellitus without complication (HCC)    GERD (gastroesophageal reflux disease)    Hypertension    Renal disorder    Past Surgical History:  Procedure Laterality Date   ABDOMINAL HYSTERECTOMY     BACK SURGERY     CARDIAC SURGERY     CARPAL TUNNEL RELEASE     CORONARY ANGIOPLASTY WITH STENT PLACEMENT     REVISION OF SCAR TISSUE RECTUS MUSCLE     TRIGGER FINGER RELEASE     Social History:  reports that she has quit smoking. She has never used smokeless tobacco.  She reports that she does not currently use alcohol. She reports that she does not currently use drugs.  Allergies  Allergen Reactions   Amlodipine Swelling    Legs swell Legs swelling    Atorvastatin Other (See Comments)    Leg cramps Leg cramps  Other reaction(s): Leg cramps, Unknown Leg cramps Leg cramps    Shrimp Extract Allergy Skin Test     Makes lips tingle Makes lips tingle  Other reaction(s): Other (See Comments) Mouth and tongue start tingling  Makes lips tingle Makes lips tingle Makes lips tingle    Sulfa Antibiotics    History reviewed. No pertinent family history. Family history: Family history reviewed and not pertinent.  Prior to Admission medications   Medication Sig Start Date End Date Taking? Authorizing Provider  aspirin 81 MG EC tablet Take 81 mg by mouth daily.   Yes [provider]  Cholecalciferol 50 MCG (2000 UT) CAPS Take 2,000 Units by mouth daily.   Yes [provider]  cloNIDine (CATAPRES) 0.3 MG tablet Take 1 tablet (0.3 mg total) by mouth 2 (two) times daily. 03/27/21 07/19/21 Yes Ward, Kristen N, DO  famotidine (PEPCID) 20 MG tablet Take 20 mg by mouth at bedtime.   Yes [provider]  ferrous sulfate 325 (65 FE) MG tablet Take 325 mg by mouth daily with breakfast.   Yes [provider]  furosemide (  LASIX) 40 MG tablet Take 40 mg by mouth 2 (two) times daily.   Yes [provider]  gabapentin (NEURONTIN) 100 MG capsule Take 100 mg by mouth 3 (three) times daily. 06/28/21  Yes [provider]  insulin degludec (TRESIBA) 100 UNIT/ML FlexTouch Pen Inject 16 Units into the skin at bedtime. 10/29/20  Yes [provider]  insulin lispro (HUMALOG) 100 UNIT/ML KwikPen Inject 0-40 Units into the skin 3 (three) times daily. 10/29/20  Yes [provider]  isosorbide mononitrate (IMDUR) 60 MG 24 hr tablet Take 60 mg by mouth daily.   Yes [provider]  levothyroxine (SYNTHROID) 75  MCG tablet Take 75 mcg by mouth daily.   Yes [provider]  metoprolol tartrate (LOPRESSOR) 25 MG tablet Take 12.5 mg by mouth daily. 07/19/21  Yes [provider]  acetaminophen (TYLENOL) 325 MG tablet Take 325-650 mg by mouth every 6 (six) hours as needed for pain.    [provider]  albuterol (VENTOLIN HFA) 108 (90 Base) MCG/ACT inhaler Inhale 2 puffs into the lungs every 4 (four) hours as needed for shortness of breath or wheezing.    [provider]  clopidogrel (PLAVIX) 75 MG tablet Take 75 mg by mouth daily. Patient not taking: Reported on 07/19/2021    [provider]  cyclobenzaprine (FLEXERIL) 5 MG tablet Take 5 mg by mouth 3 (three) times daily as needed for muscle spasms. Patient not taking: Reported on 07/19/2021    [provider]  famotidine (PEPCID) 20 MG tablet Take 20 mg by mouth every evening.    [provider]  hydrALAZINE (APRESOLINE) 25 MG tablet Take 1 tablet (25 mg total) by mouth 3 (three) times daily. 04/12/21 05/12/21  Vanessa [place], MD  insulin lispro (HUMALOG) 100 UNIT/ML KwikPen Inject 20 Units into the skin 3 (three) times daily with meals. (according to sliding scale)    [provider]  lisinopril (ZESTRIL) 20 MG tablet Take 20 mg by mouth daily. Patient not taking: Reported on 07/19/2021    [provider]  nitroGLYCERIN (NITROSTAT) 0.4 MG SL tablet Place 0.4 mg under the tongue every 5 (five) minutes x 3 doses as needed for chest pain.    [provider]  pantoprazole (PROTONIX) 40 MG tablet Take 40 mg by mouth daily. Patient not taking: Reported on 07/19/2021    [provider]  rosuvastatin (CRESTOR) 10 MG tablet Take 10 mg by mouth at bedtime. Patient not taking: Reported on 07/19/2021    [provider]  senna (SENOKOT) 8.6 MG tablet Take 8.6 mg by mouth every morning.    [provider]  sodium bicarbonate 650 MG tablet Take 650 mg by mouth  daily. Patient not taking: Reported on 07/19/2021    [provider]  sucralfate (CARAFATE) 1 g tablet Take 1 g by mouth 4 (four) times daily -  before meals and at bedtime.    [provider]   Physical Exam: Vitals:   07/19/21 2130 07/19/21 2207 07/19/21 2230 07/19/21 2302  BP: (!) 245/101 (!) 192/92 (!) 184/73 (!) 247/93  Pulse: 79 78 70 74  Resp: 18 20 20 16   Temp:      SpO2: 100% 100% 100% 100%  Weight:      Height:       Constitutional: appears age-appropriate, NAD, calm, comfortable Eyes: PERRL, lids and conjunctivae normal ENMT: Mucous membranes are moist. Posterior pharynx clear of any exudate or lesions. Age-appropriate dentition. Hearing appropriate Neck:  normal, supple, no masses, no thyromegaly Respiratory: clear to auscultation bilaterally, no wheezing, no crackles. Normal respiratory effort. No accessory muscle use.  Cardiovascular: Regular rate and rhythm, no murmurs / rubs / gallops.  Bilateral lower extremity edema. 2+ pedal pulses. No carotid bruits.  Abdomen: Obese abdomen, no tenderness, no masses palpated, no hepatosplenomegaly. Bowel sounds positive.  Musculoskeletal: no clubbing / cyanosis. No joint deformity upper and lower extremities. Good ROM, no contractures, no atrophy. Normal muscle tone.  Skin: no rashes, lesions, ulcers. No induration Neurologic: Sensation intact. Strength 5/5 in all 4.  Psychiatric: Normal judgment and insight. Alert and oriented x 3. Normal mood.   EKG: independently reviewed, showing sinus rhythm with rate of 91, QTc 446  Chest x-ray on Admission: I personally reviewed and I agree with radiologist reading as below.  DG Chest 2 View  Result Date: 07/19/2021 CLINICAL DATA:  Centralized chest pain, nausea, shortness of breath EXAM: CHEST - 2 VIEW COMPARISON:  02/07/2021 FINDINGS: Frontal and lateral views of the chest demonstrate 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 Cardiac silhouette is unremarkable. No acute  airspace disease, effusion, or pneumothorax. There are no acute bony abnormalities. IMPRESSION: 1. No acute intrathoracic process. Electronically Signed   By: Randa Ngo M.D.   On: 07/19/2021 18:56   CT HEAD WO CONTRAST (5MM)  Result Date: 07/19/2021 CLINICAL DATA:  Hypertensive emergency hypertensive urgency, sbp 235 EXAM: CT HEAD WITHOUT CONTRAST TECHNIQUE: Contiguous axial images were obtained from the base of the skull through the vertex without intravenous contrast. RADIATION DOSE REDUCTION: This exam was performed according to the departmental dose-optimization program which includes automated exposure control, adjustment of the mA and/or kV according to patient size and/or use of iterative reconstruction technique. COMPARISON:  CT head 04/12/2021 FINDINGS: Brain: No evidence of large-territorial acute infarction. No parenchymal hemorrhage. No mass lesion. No extra-axial collection. No mass effect or midline shift. No hydrocephalus. Basilar cisterns are patent. Vascular: No hyperdense vessel. Atherosclerotic calcifications are present within the cavernous internal carotid arteries. Skull: No acute fracture or focal lesion. Sinuses/Orbits: Paranasal sinuses and mastoid air cells are clear. The orbits are unremarkable. Other: None. IMPRESSION: No acute intracranial abnormality. Electronically Signed   By: Iven Finn M.D.   On: 07/19/2021 22:49   CT CHEST ABDOMEN PELVIS WO CONTRAST  Result Date: 07/19/2021 CLINICAL DATA:  Chest pain for 3 days, nausea, mild dizziness EXAM: CT CHEST, ABDOMEN AND PELVIS WITHOUT CONTRAST TECHNIQUE: Multidetector CT imaging of the chest, abdomen and pelvis was performed following the standard protocol without IV contrast. Evaluation of the vascular structures as well as the abdominopelvic viscera is severely limited without IV contrast. RADIATION DOSE REDUCTION: This exam was performed according to the departmental dose-optimization program which includes automated  exposure control, adjustment of the mA and/or kV according to patient size and/or use of iterative reconstruction technique. 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 Limited unenhanced imaging of the heart and great vessels demonstrates no pericardial fluid. Normal caliber of the thoracic aorta, with mild atherosclerosis of the aortic arch. Evaluation of the vascular lumen is limited without IV contrast. Mediastinum/Nodes: No enlarged mediastinal, hilar, or axillary lymph nodes. Thyroid gland, trachea, and esophagus demonstrate no significant findings. Lungs/Pleura: Scattered areas of ground-glass airspace disease are seen within the lower lung zones, greatest in the lower lobes. The appearance is most suggestive of nonspecific inflammation or infection, including atypical infection such as COVID-19. No effusion or pneumothorax. 5 mm right middle  lobe pulmonary nodule reference image 67/4. No other pulmonary nodules or masses. Central airways are widely patent. Musculoskeletal: No acute or destructive bony lesions. Reconstructed images demonstrate no additional findings. CT ABDOMEN PELVIS FINDINGS Hepatobiliary: Unremarkable unenhanced appearance of the liver and gallbladder. Pancreas: Unremarkable unenhanced appearance. Spleen: Unremarkable unenhanced appearance. Adrenals/Urinary Tract: No urinary tract calculi or obstructive uropathy within either kidney. 2.4 cm cyst lower pole right kidney. The adrenals are unremarkable. The bladder is decompressed, limiting its evaluation. Stomach/Bowel: No bowel obstruction or ileus. Normal appendix right lower quadrant. No bowel wall thickening or inflammatory change. Vascular/Lymphatic: Evaluation of the vascular lumen is limited without IV contrast. Extensive atherosclerosis of the aorta and bilateral iliac vessels. No pathologic adenopathy within the abdomen or pelvis. Reproductive: Status  post hysterectomy. No adnexal masses. Other: No free fluid or free gas.  No abdominal wall hernia. Musculoskeletal: No acute or destructive bony lesions. Reconstructed images demonstrate no additional findings. IMPRESSION: 1. Diffuse aortic atherosclerosis. Evaluation of the vascular lumen is limited without IV contrast. 2. Scattered bibasilar ground-glass airspace disease consistent with nonspecific infection or inflammation. Atypical infection such as COVID-19 can give this appearance. 3. 5 mm right solid pulmonary nodule. No routine follow-up imaging is recommended per Fleischner Society Guidelines. These guidelines do not apply to immunocompromised patients and patients with cancer. Follow up in patients with significant comorbidities as clinically warranted. For lung cancer screening, adhere to Lung-RADS guidelines. Reference: Radiology. 2017; 284(1):228-43. 4. No acute intra-abdominal or intrapelvic process. Electronically Signed   By: Randa Ngo M.D.   On: 07/19/2021 20:52    Labs on Admission: I have personally reviewed following labs  CBC: Recent Labs  Lab 07/19/21 1934  WBC 6.7  HGB 9.4*  HCT 28.7*  MCV 89.4  PLT 151   Basic Metabolic Panel: Recent Labs  Lab 07/19/21 1934  NA 138  K 4.9  CL 107  CO2 22  GLUCOSE 145*  BUN 57*  CREATININE 4.31*  CALCIUM 8.5*   GFR: Estimated Creatinine Clearance: 18.5 mL/min (A) (by C-G formula based on SCr of 4.31 mg/dL (H)).  Liver Function Tests: Recent Labs  Lab 07/19/21 1934  AST 23  ALT 27  ALKPHOS 72  BILITOT 0.8  PROT 6.9  ALBUMIN 2.9*   Urine analysis:    Component Value Date/Time   COLORURINE STRAW (A) 04/12/2021 0801   APPEARANCEUR CLEAR (A) 04/12/2021 0801   LABSPEC 1.011 04/12/2021 0801   PHURINE 6.0 04/12/2021 0801   GLUCOSEU 50 (A) 04/12/2021 0801   HGBUR SMALL (A) 04/12/2021 0801   BILIRUBINUR NEGATIVE 04/12/2021 0801   KETONESUR NEGATIVE 04/12/2021 0801   PROTEINUR 100 (A) 04/12/2021 0801   NITRITE  NEGATIVE 04/12/2021 0801   LEUKOCYTESUR NEGATIVE 04/12/2021 0801   CRITICAL CARE Performed by: Briant Cedar Tyrick Dunagan  Total critical care time: 35 minutes  Critical care time was exclusive of separately billable procedures and treating other patients.  Critical care was necessary to treat or prevent imminent or life-threatening deterioration.  Circulatory failure  Critical care was time spent personally by me on the following activities: development of treatment plan with patient and/or surrogate as well as nursing, discussions with consultants, evaluation of patient's response to treatment, examination of patient, obtaining history from patient or surrogate, ordering and performing treatments and interventions, ordering and review of laboratory studies, ordering and review of radiographic studies, pulse oximetry and re-evaluation of patient's condition.  Dr. Tobie Poet [facility]  If 7PM-7AM, please contact overnight-coverage provider If 7AM-7PM, please contact day coverage provider  [email or web address]  07/19/2021, 11:56 PM

## 2021-07-20 ENCOUNTER — Inpatient Hospital Stay: Payer: 59

## 2021-07-20 ENCOUNTER — Inpatient Hospital Stay (HOSPITAL_COMMUNITY)
Admit: 2021-07-20 | Discharge: 2021-07-20 | Disposition: A | Discharge status: Home / Self Care | Payer: 59 | Attending: Internal Medicine | Admitting: Internal Medicine

## 2021-07-20 DIAGNOSIS — Z794 Long term (current) use of insulin: Secondary | ICD-10-CM | POA: Diagnosis not present

## 2021-07-20 DIAGNOSIS — R0609 Other forms of dyspnea: Secondary | ICD-10-CM | POA: Diagnosis not present

## 2021-07-20 DIAGNOSIS — M79604 Pain in right leg: Secondary | ICD-10-CM | POA: Diagnosis present

## 2021-07-20 DIAGNOSIS — N2581 Secondary hyperparathyroidism of renal origin: Secondary | ICD-10-CM | POA: Diagnosis present

## 2021-07-20 DIAGNOSIS — I160 Hypertensive urgency: Secondary | ICD-10-CM | POA: Diagnosis present

## 2021-07-20 DIAGNOSIS — Z955 Presence of coronary angioplasty implant and graft: Secondary | ICD-10-CM | POA: Diagnosis not present

## 2021-07-20 DIAGNOSIS — Z882 Allergy status to sulfonamides status: Secondary | ICD-10-CM | POA: Diagnosis not present

## 2021-07-20 DIAGNOSIS — Z91013 Allergy to seafood: Secondary | ICD-10-CM | POA: Diagnosis not present

## 2021-07-20 DIAGNOSIS — N184 Chronic kidney disease, stage 4 (severe): Secondary | ICD-10-CM | POA: Diagnosis present

## 2021-07-20 DIAGNOSIS — Z888 Allergy status to other drugs, medicaments and biological substances status: Secondary | ICD-10-CM | POA: Diagnosis not present

## 2021-07-20 DIAGNOSIS — Z6839 Body mass index (BMI) 39.0-39.9, adult: Secondary | ICD-10-CM | POA: Diagnosis not present

## 2021-07-20 DIAGNOSIS — N170 Acute kidney failure with tubular necrosis: Secondary | ICD-10-CM | POA: Diagnosis present

## 2021-07-20 DIAGNOSIS — Z79899 Other long term (current) drug therapy: Secondary | ICD-10-CM | POA: Diagnosis not present

## 2021-07-20 DIAGNOSIS — G4733 Obstructive sleep apnea (adult) (pediatric): Secondary | ICD-10-CM | POA: Diagnosis present

## 2021-07-20 DIAGNOSIS — E1122 Type 2 diabetes mellitus with diabetic chronic kidney disease: Secondary | ICD-10-CM | POA: Diagnosis present

## 2021-07-20 DIAGNOSIS — I161 Hypertensive emergency: Secondary | ICD-10-CM | POA: Diagnosis present

## 2021-07-20 DIAGNOSIS — Z20822 Contact with and (suspected) exposure to covid-19: Secondary | ICD-10-CM | POA: Diagnosis present

## 2021-07-20 DIAGNOSIS — D631 Anemia in chronic kidney disease: Secondary | ICD-10-CM | POA: Diagnosis present

## 2021-07-20 DIAGNOSIS — E039 Hypothyroidism, unspecified: Secondary | ICD-10-CM | POA: Diagnosis present

## 2021-07-20 DIAGNOSIS — I2510 Atherosclerotic heart disease of native coronary artery without angina pectoris: Secondary | ICD-10-CM | POA: Diagnosis present

## 2021-07-20 DIAGNOSIS — K219 Gastro-esophageal reflux disease without esophagitis: Secondary | ICD-10-CM | POA: Diagnosis present

## 2021-07-20 DIAGNOSIS — Z87891 Personal history of nicotine dependence: Secondary | ICD-10-CM | POA: Diagnosis not present

## 2021-07-20 DIAGNOSIS — D509 Iron deficiency anemia, unspecified: Secondary | ICD-10-CM | POA: Diagnosis present

## 2021-07-20 DIAGNOSIS — E669 Obesity, unspecified: Secondary | ICD-10-CM | POA: Diagnosis present

## 2021-07-20 DIAGNOSIS — I129 Hypertensive chronic kidney disease with stage 1 through stage 4 chronic kidney disease, or unspecified chronic kidney disease: Secondary | ICD-10-CM | POA: Diagnosis present

## 2021-07-20 DIAGNOSIS — E785 Hyperlipidemia, unspecified: Secondary | ICD-10-CM | POA: Diagnosis present

## 2021-07-20 LAB — ECHOCARDIOGRAM COMPLETE
AR max vel: 2.91 cm2
AV Peak grad: 4.8 mmHg
Ao pk vel: 1.1 m/s
Area-P 1/2: 2.07 cm2
Calc EF: 57.2 %
Height: 69 in
S' Lateral: 3.35 cm
Single Plane A2C EF: 50.1 %
Single Plane A4C EF: 65.6 %
Weight: 4320 oz

## 2021-07-20 LAB — CBG MONITORING, ED
Glucose-Capillary: 168 mg/dL — ABNORMAL HIGH (ref 70–99)
Glucose-Capillary: 182 mg/dL — ABNORMAL HIGH (ref 70–99)
Glucose-Capillary: 222 mg/dL — ABNORMAL HIGH (ref 70–99)
Glucose-Capillary: 153 mg/dL — ABNORMAL HIGH (ref 70–99)

## 2021-07-20 LAB — CBC
HCT: 28.9 % — ABNORMAL LOW (ref 36.0–46.0)
Hemoglobin: 9.6 g/dL — ABNORMAL LOW (ref 12.0–15.0)
MCH: 29.3 pg (ref 26.0–34.0)
MCHC: 33.2 g/dL (ref 30.0–36.0)
MCV: 88.1 fL (ref 80.0–100.0)
Platelets: 193 10*3/uL (ref 150–400)
RBC: 3.28 MIL/uL — ABNORMAL LOW (ref 3.87–5.11)
RDW: 12.8 % (ref 11.5–15.5)
WBC: 6.9 10*3/uL (ref 4.0–10.5)
nRBC: 0 % (ref 0.0–0.2)

## 2021-07-20 LAB — IRON AND TIBC
Iron: 32 ug/dL (ref 28–170)
Saturation Ratios: 13 % (ref 10.4–31.8)
TIBC: 252 ug/dL (ref 250–450)
UIBC: 220 ug/dL

## 2021-07-20 LAB — FOLATE: Folate: 16 ng/mL (ref >5.9)

## 2021-07-20 LAB — BASIC METABOLIC PANEL
Anion gap: 7 (ref 5–15)
BUN: 54 mg/dL — ABNORMAL HIGH (ref 8–23)
CO2: 20 mmol/L — ABNORMAL LOW (ref 22–32)
Calcium: 8.4 mg/dL — ABNORMAL LOW (ref 8.9–10.3)
Chloride: 111 mmol/L (ref 98–111)
Creatinine, Ser: 4.29 mg/dL — ABNORMAL HIGH (ref 0.44–1.00)
GFR, Estimated: 11 mL/min — ABNORMAL LOW (ref >60)
Glucose, Bld: 159 mg/dL — ABNORMAL HIGH (ref 70–99)
Potassium: 4.8 mmol/L (ref 3.5–5.1)
Sodium: 138 mmol/L (ref 135–145)

## 2021-07-20 LAB — FERRITIN: Ferritin: 164 ng/mL (ref 11–307)

## 2021-07-20 LAB — BRAIN NATRIURETIC PEPTIDE: B Natriuretic Peptide: 566.7 pg/mL — ABNORMAL HIGH (ref 0.0–100.0)

## 2021-07-20 MED ORDER — PERFLUTREN LIPID MICROSPHERE
1.0000 mL | INTRAVENOUS | Status: AC | PRN
  Administered 2021-07-20: 4 mL via INTRAVENOUS
  Filled 2021-07-20: qty 10

## 2021-07-20 MED ORDER — HYDRALAZINE HCL 50 MG PO TABS
50.0000 mg | ORAL_TABLET | Freq: Three times a day (TID) | ORAL | Status: DC
  Administered 2021-07-20 – 2021-07-21 (×3): 50 mg via ORAL
  Filled 2021-07-20 (×3): qty 1

## 2021-07-20 NOTE — Consult Note (Signed)
Amanda Montgomery MRN: [id number] DOB/AGE: [birth year] 65 y.o. Primary Care Physician:Pcp, No Admit date: 07/19/2021 Chief Complaint:  Chief Complaint  Patient presents with   Chest Pain   HPI: Patient is a 66 year old African-American female with a past medical history of hypertension, hyperlipidemia, diabetes mellitus type 2, hypothyroidism who came to the ER with chief complaint of chest pain.   Upon evaluation in the ER patient was found to have acute kidney injury and hypertensive urgency as patient's blood pressure was 235/98.  Patient was admitted for further care.  Nephrology was consulted for acute kidney injury. Patient was seen today in the ER. Patient says her chest pain is better Patient does not offer any complaint of shortness of breath No complaint of fever cough or chills Patient's outpatient antihypertensive regimen has been resumed and patient blood pressure has come down to 130/65 Patient offers no complaint of hematuria No complaint of hematemesis No complaint of syncope  Past Medical History:  Diagnosis Date   CAD (coronary artery disease)    Diabetes mellitus without complication (HCC)    GERD (gastroesophageal reflux disease)    Hypertension    Renal disorder         History reviewed. No pertinent family history.  Social History:  reports that she has quit smoking. She has never used smokeless tobacco. She reports that she does not currently use alcohol. She reports that she does not currently use drugs.   Allergies:  Allergies  Allergen Reactions   Amlodipine Swelling    Legs swell Legs swelling    Atorvastatin Other (See Comments)    Leg cramps Leg cramps  Other reaction(s): Leg cramps, Unknown Leg cramps Leg cramps    Shrimp Extract Allergy Skin Test     Makes lips tingle Makes lips tingle  Other reaction(s): Other (See Comments) Mouth and tongue start tingling  Makes lips tingle Makes lips tingle Makes lips tingle    Sulfa  Antibiotics     (Not in a hospital admission)      WPY:KDXIP from the symptoms mentioned above,there are no other symptoms referable to all systems reviewed.   cholecalciferol  2,000 Units Oral Daily   cloNIDine  0.3 mg Oral BID   famotidine  20 mg Oral QHS   ferrous sulfate  325 mg Oral Q breakfast   gabapentin  100 mg Oral TID   heparin  5,000 Units Subcutaneous Q8H   hydrALAZINE  50 mg Oral TID   insulin aspart  0-20 Units Subcutaneous TID WC   insulin aspart  0-5 Units Subcutaneous QHS   insulin glargine-yfgn  16 Units Subcutaneous QHS   isosorbide mononitrate  60 mg Oral Daily   labetalol  20 mg Intravenous Once   levothyroxine  75 mcg Oral Q0600   metoprolol tartrate  12.5 mg Oral Daily     Physical Exam: Vital signs in last 24 hours: Temp:  [98.1 F (36.7 C)] 98.1 F (36.7 C) (01/27 1832) Pulse Rate:  [50-88] 71 (01/28 0630) Resp:  [13-22] 14 (01/28 0630) BP: (128-247)/(50-116) 128/65 (01/28 0630) SpO2:  [97 %-100 %] 99 % (01/28 0630) Weight:  [122.5 kg] 122.5 kg (01/27 1832) Weight change:     Intake/Output from previous day: 01/27 0701 - 01/28 0700 In: 22.6 [I.V.:22.6] Out: -  No intake/output data recorded.   Physical Exam:  General- pt is awake,alert, oriented to time place and person  Resp- No acute REsp distress, CTA B/L NO Rhonchi  CVS- S1S2 regular in rate and  rhythm  GIT- BS+, soft, NT, ND  EXT- NO LE Edema, Cyanosis  CNS- CN 2-12 grossly intact. Moving all 4 extremities  Psych- normal mood and affect    Lab Results: CBC Recent Labs    07/19/21 1934 07/20/21 0736  WBC 6.7 6.9  HGB 9.4* 9.6*  HCT 28.7* 28.9*  PLT 204 193    BMET Recent Labs    07/19/21 1934 07/20/21 0736  NA 138 138  K 4.9 4.8  CL 107 111  CO2 22 20*  GLUCOSE 145* 159*  BUN 57* 54*  CREATININE 4.31* 4.29*  CALCIUM 8.5* 8.4*    MICRO Recent Results (from the past 240 hour(s))  Resp Panel by RT-PCR (Flu A&B, Covid) Nasopharyngeal Swab      Status: None   Collection Time: 07/19/21  8:54 PM   Specimen: Nasopharyngeal Swab; Nasopharyngeal(NP) swabs in vial transport medium  Result Value Ref Range Status   SARS Coronavirus 2 by RT PCR NEGATIVE NEGATIVE Final    Comment: (NOTE) SARS-CoV-2 target nucleic acids are NOT DETECTED.  The SARS-CoV-2 RNA is generally detectable in upper respiratory specimens during the acute phase of infection. The lowest concentration of SARS-CoV-2 viral copies this assay can detect is 138 copies/mL. A negative result does not preclude SARS-Cov-2 infection and should not be used as the sole basis for treatment or other patient management decisions. A negative result may occur with  improper specimen collection/handling, submission of specimen other than nasopharyngeal swab, presence of viral mutation(s) within the areas targeted by this assay, and inadequate number of viral copies(<138 copies/mL). A negative result must be combined with clinical observations, patient history, and epidemiological information. The expected result is Negative.  Fact Sheet for Patients:  EntrepreneurPulse.com.au  Fact Sheet for Healthcare Providers:  IncredibleEmployment.be  This test is no t yet approved or cleared by the [place] FDA and  has been authorized for detection and/or diagnosis of SARS-CoV-2 by FDA under an Emergency Use Authorization (EUA). This EUA will remain  in effect (meaning this test can be used) for the duration of the COVID-19 declaration under Section 564(b)(1) of the Act, 21 U.S.C.section 360bbb-3(b)(1), unless the authorization is terminated  or revoked sooner.       Influenza A by PCR NEGATIVE NEGATIVE Final   Influenza B by PCR NEGATIVE NEGATIVE Final    Comment: (NOTE) The Xpert Xpress SARS-CoV-2/FLU/RSV plus assay is intended as an aid in the diagnosis of influenza from Nasopharyngeal swab specimens and should not be used as a sole basis  for treatment. Nasal washings and aspirates are unacceptable for Xpert Xpress SARS-CoV-2/FLU/RSV testing.  Fact Sheet for Patients: EntrepreneurPulse.com.au  Fact Sheet for Healthcare Providers: IncredibleEmployment.be  This test is not yet approved or cleared by the [place] FDA and has been authorized for detection and/or diagnosis of SARS-CoV-2 by FDA under an Emergency Use Authorization (EUA). This EUA will remain in effect (meaning this test can be used) for the duration of the COVID-19 declaration under Section 564(b)(1) of the Act, 21 U.S.C. section 360bbb-3(b)(1), unless the authorization is terminated or revoked.  Performed at [facility], [address] [place], [place] [address]       Lab Results  Component Value Date   CALCIUM 8.4 (L) 07/20/2021      Impression:      1)Renal   AKI secondary to ATN Patient has AKI on CKD Patient has CKD stage IV Patient has CKD since 2014 Patient has had a creatinine of 3.3 going  back to April 22, 2021 Patient has CKD secondary to diabetes mellitus  As an outpatient patient was on RAS blockers Agree with holding RAS blockers because of her AKI   2) hypertensive urgency Patient's blood pressure is stable    3)Anemia of chronic disease  CBC Latest Ref Rng & Units 07/20/2021 07/19/2021 04/12/2021  WBC 4.0 - 10.5 K/uL 6.9 6.7 6.2  Hemoglobin 12.0 - 15.0 g/dL 9.6(L) 9.4(L) 9.8(L)  Hematocrit 36.0 - 46.0 % 28.9(L) 28.7(L) 28.6(L)  Platelets 150 - 400 K/uL 193 204 225       HGb at goal (9--11)   4) Secondary hyperparathyroidism -CKD Mineral-Bone Disorder Patient has a history of secondary hyperparathyroidism with intact PTH at 235 and going back to April 22, 2021   Lab Results  Component Value Date   CALCIUM 8.4 (L) 07/20/2021    Secondary Hyperparathyroidism present Phosphorus will check   5)Proteinuria Patient has had  proteinuria going back to  2020 Patient RAS blockers are on hold because of AKI  6) Electrolytes   BMP Latest Ref Rng & Units 07/20/2021 07/19/2021 04/12/2021  Glucose 70 - 99 mg/dL 159(H) 145(H) 136(H)  BUN 8 - 23 mg/dL 54(H) 57(H) 60(H)  Creatinine 0.44 - 1.00 mg/dL 4.29(H) 4.31(H) 3.25(H)  Sodium 135 - 145 mmol/L 138 138 138  Potassium 3.5 - 5.1 mmol/L 4.8 4.9 5.0  Chloride 98 - 111 mmol/L 111 107 105  CO2 22 - 32 mmol/L 20(L) 22 25  Calcium 8.9 - 10.3 mg/dL 8.4(L) 8.5(L) 8.8(L)     Sodium Normonatremic   Potassium Normokalemic    7)Acid base-chronic metabolic acidosis Patient bicarb is just below the goal Patient has bicarb going down to 19--20 going back to May 2022 We will follow for now   8)Diastolic CHF Patient is currently well compensated   Plan  Agree with her current treatment plan Will ask for renal ultrasound to rule out obstructive issues Agree with holding patient's RAS blockers because of her AKI We will ask for anemia profile to see if patient needs IV iron No need for renal placement therapy No need for Epogen for now No need for p.o. bicarb for now     Junie Avilla s Jahmar Mckelvy 07/20/2021, 9:29 AM

## 2021-07-20 NOTE — ED Notes (Signed)
Pt disconnected to get up to go to restroom.  Pt ambulatory to bathroom without assistance.  Pt able to get back to bed and this RN hooked her back up to monitor.  Pt denies any further needs at this time.

## 2021-07-20 NOTE — ED Notes (Signed)
Pt denies needs, watching tv, vss, call bell at side.

## 2021-07-20 NOTE — Progress Notes (Signed)
*  PRELIMINARY RESULTS* Echocardiogram 2D Echocardiogram has been performed. Definity IV Contrast was used on this study to enhance endocardial definition.  Amanda Montgomery 07/20/2021, 8:21 AM

## 2021-07-20 NOTE — ED Notes (Signed)
Admitting provider at bedside.

## 2021-07-20 NOTE — ED Notes (Signed)
Pt up OOB to go to bathroom.  Bed changed from ER stretcher to hospital bed for comfort.  Pt given PRN tylenol at this time for HA.  Pt denies any other needs.

## 2021-07-20 NOTE — ED Notes (Signed)
Waiting on semglee from pharmacy

## 2021-07-20 NOTE — Progress Notes (Addendum)
PROGRESS NOTE    Amanda Montgomery  TDV:[id number] DOB: 1956/05/22 DOA: 07/19/2021 PCP: Pcp, No    Brief Narrative:  66 year old female with history of hypertension, hyperlipidemia, insulin-dependent diabetes mellitus, hypothyroid, GERD, neuropathy, iron deficiency anemia, who presents to the emergency department for chief concerns of chest pain.   Initial vitals in the emergency department showed temperature 98.1, respiration rate of 20, heart rate of 62, blood pressure 235 over 98, improved to 192/92, respiration rate with 99% on room air.  Labs in the ED showed serum sodium 138, potassium 4.9, chloride 107, bicarb 22, BUN of 57, serum creatinine of 4.31, GFR of 11, nonfasting blood glucose 145, WBC was 6.7, hemoglobin 9.4, platelets of 204.   Assessment & Plan:   Principal Problem:   Hypertensive urgency Active Problems:   Chest pain   Type 2 diabetes mellitus with stage 4 chronic kidney disease (HCC)   CKD (chronic kidney disease) stage 4, GFR 15-29 ml/min (HCC)   AKI (acute kidney injury) (HCC)   Dyspnea on exertion   Swelling of lower extremity   OSA on CPAP  * Hypertensive urgency Assessment & Plan - Resumed home antihypertensives including clonidine 0.3 mg p.o. twice daily, hydralazine 25 mg p.o. 3 times daily, isosorbide mononitrate 60 mg daily, metoprolol tartrate 12.5 mg p.o. twice daily - Discontinued nitroglycerin gtt. - Increase home dose of hydralazine to 50mg  TID -Attempt to wean from cardene gtt - If able to wean from drip will admit to cardiac telemetry    Endocrine Type 2 diabetes mellitus with stage 4 chronic kidney disease (HCC) Assessment & Plan - Insulin-dependent - Resumed home long-acting insulin degludec Tyler Aas) 16 units subcutaneous nightly - Insulin SSI, obese/resistant dosing, with at bedtime coverage     Genitourinary AKI (acute kidney injury) on CKD stage IV Assessment & Plan - Presumed secondary to cardiorenal - Could be progressiion  of CKD IV - Nephrology consulted -Avoid hypotenion -Avoid nephrotoxins - BMP in the a.m.   Other Swelling of lower extremity Assessment & Plan - Left greater than the right - With bilateral lower extremity pain - Ultrasound of the lower extremity ordered to assess for DVT: NEGATIVE   Dyspnea on exertion Assessment & Plan - Differentials include new onset heart failure exacerbation - Check stat BNP, furosemide 40 mg IV one-time dose ordered - Complete echo ordered, pending - Strict I's and O's   Chest pain Assessment & Plan - Resolved at this time - Low suspcion for ACS - Suspect secondary to hypertensive urgency   DVT prophylaxis: SQ heparin Code Status: FULL Family Communication:None today Disposition Plan: Status is: Inpatient  Remains inpatient appropriate because: Hypertensive urgency, AKI on CKD IV       Level of care: Stepdown  Consultants:  Nephrology  Procedures:  None  Antimicrobials: None   Subjective: Seen and examined.  Resting comfortably in the room.  No visible distress.  Objective: Vitals:   07/20/21 0900 07/20/21 0930 07/20/21 1000 07/20/21 1010  BP: (!) 168/64 138/72 (!) 154/65 (!) 154/65  Pulse: 65 68 63   Resp: 16 16 18    Temp:      SpO2: 97% 99% 99%   Weight:      Height:        Intake/Output Summary (Last 24 hours) at 07/20/2021 1349 Last data filed at 07/19/2021 2333 Gross per 24 hour  Intake 22.64 ml  Output --  Net 22.64 ml   Filed Weights   07/19/21 1832  Weight: 122.5 kg  Examination:  General exam: Appears calm and comfortable  Respiratory system: Clear to auscultation. Respiratory effort normal. Cardiovascular system: S1-S2, RRR, distant lung sounds, trace bilateral lower extremity edema Gastrointestinal system: Obese, NT/ND, normal bowel sounds Central nervous system: Alert and oriented. No focal neurological deficits. Extremities: Symmetric 5 x 5 power. Skin: No rashes, lesions or ulcers Psychiatry:  Judgement and insight appear normal. Mood & affect appropriate.     Data Reviewed: I have personally reviewed following labs and imaging studies  CBC: Recent Labs  Lab 07/19/21 1934 07/20/21 0736  WBC 6.7 6.9  HGB 9.4* 9.6*  HCT 28.7* 28.9*  MCV 89.4 88.1  PLT 204 782   Basic Metabolic Panel: Recent Labs  Lab 07/19/21 1934 07/20/21 0736  NA 138 138  K 4.9 4.8  CL 107 111  CO2 22 20*  GLUCOSE 145* 159*  BUN 57* 54*  CREATININE 4.31* 4.29*  CALCIUM 8.5* 8.4*   GFR: Estimated Creatinine Clearance: 18.6 mL/min (A) (by C-G formula based on SCr of 4.29 mg/dL (H)). Liver Function Tests: Recent Labs  Lab 07/19/21 1934  AST 23  ALT 27  ALKPHOS 72  BILITOT 0.8  PROT 6.9  ALBUMIN 2.9*   No results for input(s): LIPASE, AMYLASE in the last 168 hours. No results for input(s): AMMONIA in the last 168 hours. Coagulation Profile: No results for input(s): INR, PROTIME in the last 168 hours. Cardiac Enzymes: No results for input(s): CKTOTAL, CKMB, CKMBINDEX, TROPONINI in the last 168 hours. BNP (last 3 results) No results for input(s): PROBNP in the last 8760 hours. HbA1C: No results for input(s): HGBA1C in the last 72 hours. CBG: Recent Labs  Lab 07/19/21 2340 07/20/21 0808 07/20/21 1154  GLUCAP 131* 168* 182*   Lipid Profile: No results for input(s): CHOL, HDL, LDLCALC, TRIG, CHOLHDL, LDLDIRECT in the last 72 hours. Thyroid Function Tests: No results for input(s): TSH, T4TOTAL, FREET4, T3FREE, THYROIDAB in the last 72 hours. Anemia Panel: No results for input(s): VITAMINB12, FOLATE, FERRITIN, TIBC, IRON, RETICCTPCT in the last 72 hours. Sepsis Labs: No results for input(s): PROCALCITON, LATICACIDVEN in the last 168 hours.  Recent Results (from the past 240 hour(s))  Resp Panel by RT-PCR (Flu A&B, Covid) Nasopharyngeal Swab     Status: None   Collection Time: 07/19/21  8:54 PM   Specimen: Nasopharyngeal Swab; Nasopharyngeal(NP) swabs in vial transport medium   Result Value Ref Range Status   SARS Coronavirus 2 by RT PCR NEGATIVE NEGATIVE Final    Comment: (NOTE) SARS-CoV-2 target nucleic acids are NOT DETECTED.  The SARS-CoV-2 RNA is generally detectable in upper respiratory specimens during the acute phase of infection. The lowest concentration of SARS-CoV-2 viral copies this assay can detect is 138 copies/mL. A negative result does not preclude SARS-Cov-2 infection and should not be used as the sole basis for treatment or other patient management decisions. A negative result may occur with  improper specimen collection/handling, submission of specimen other than nasopharyngeal swab, presence of viral mutation(s) within the areas targeted by this assay, and inadequate number of viral copies(<138 copies/mL). A negative result must be combined with clinical observations, patient history, and epidemiological information. The expected result is Negative.  Fact Sheet for Patients:  EntrepreneurPulse.com.au  Fact Sheet for Healthcare Providers:  IncredibleEmployment.be  This test is no t yet approved or cleared by the [place] FDA and  has been authorized for detection and/or diagnosis of SARS-CoV-2 by FDA under an Emergency Use Authorization (EUA). This EUA will remain  in  effect (meaning this test can be used) for the duration of the COVID-19 declaration under Section 564(b)(1) of the Act, 21 U.S.C.section 360bbb-3(b)(1), unless the authorization is terminated  or revoked sooner.       Influenza A by PCR NEGATIVE NEGATIVE Final   Influenza B by PCR NEGATIVE NEGATIVE Final    Comment: (NOTE) The Xpert Xpress SARS-CoV-2/FLU/RSV plus assay is intended as an aid in the diagnosis of influenza from Nasopharyngeal swab specimens and should not be used as a sole basis for treatment. Nasal washings and aspirates are unacceptable for Xpert Xpress SARS-CoV-2/FLU/RSV testing.  Fact Sheet for  Patients: EntrepreneurPulse.com.au  Fact Sheet for Healthcare Providers: IncredibleEmployment.be  This test is not yet approved or cleared by the [place] FDA and has been authorized for detection and/or diagnosis of SARS-CoV-2 by FDA under an Emergency Use Authorization (EUA). This EUA will remain in effect (meaning this test can be used) for the duration of the COVID-19 declaration under Section 564(b)(1) of the Act, 21 U.S.C. section 360bbb-3(b)(1), unless the authorization is terminated or revoked.  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 Cardiac silhouette is unremarkable. No acute airspace disease, effusion, or pneumothorax. There are no acute bony abnormalities. IMPRESSION: 1. No acute intrathoracic process. Electronically Signed   By: Randa Ngo M.D.   On: 07/19/2021 18:56   CT HEAD WO CONTRAST (5MM)  Result Date: 07/19/2021 CLINICAL DATA:  Hypertensive emergency hypertensive urgency, sbp 235 EXAM: CT HEAD WITHOUT CONTRAST TECHNIQUE: Contiguous axial images were obtained from the base of the skull through the vertex without intravenous contrast. RADIATION DOSE REDUCTION: This exam was performed according to the departmental dose-optimization program which includes automated exposure control, adjustment of the mA and/or kV according to patient size and/or use of iterative reconstruction technique. COMPARISON:  CT head 04/12/2021 FINDINGS: Brain: No evidence of large-territorial acute infarction. No parenchymal hemorrhage. No mass lesion. No extra-axial collection. No mass effect or midline shift. No hydrocephalus.  Basilar cisterns are patent. Vascular: No hyperdense vessel. Atherosclerotic calcifications are present within the cavernous internal carotid arteries. Skull: No acute fracture or focal lesion. Sinuses/Orbits: Paranasal sinuses and mastoid air cells are clear. The orbits are unremarkable. Other: None. IMPRESSION: No acute intracranial abnormality. Electronically Signed   By: Iven Finn M.D.   On: 07/19/2021 22:49   US Venous Img Lower Bilateral (DVT)  Result Date: 07/20/2021 CLINICAL DATA:  Bilateral lower extremity pain and swelling EXAM: BILATERAL LOWER EXTREMITY VENOUS DOPPLER ULTRASOUND TECHNIQUE: Gray-scale sonography with graded compression, as well as color Doppler and duplex ultrasound were performed to evaluate the lower extremity deep venous systems from the level of the common femoral vein and including the common femoral, femoral, profunda femoral, popliteal and calf veins including the posterior tibial, peroneal and gastrocnemius veins when visible. The superficial great saphenous vein was also interrogated. Spectral Doppler was utilized to evaluate flow at rest and with distal augmentation maneuvers in the common femoral, femoral and popliteal veins. COMPARISON:  None. FINDINGS: RIGHT LOWER EXTREMITY Common Femoral Vein: No evidence of thrombus. Normal compressibility, respiratory phasicity and response to augmentation. Saphenofemoral Junction: No evidence of thrombus. Normal compressibility and flow on color Doppler imaging. Profunda  Femoral Vein: No evidence of thrombus. Normal compressibility and flow on color Doppler imaging. Femoral Vein: No evidence of thrombus. Normal compressibility, respiratory phasicity and response to augmentation. Popliteal Vein: No evidence of thrombus. Normal compressibility, respiratory phasicity and response to augmentation. Calf Veins: Not well visualized Superficial Great Saphenous Vein: No evidence of thrombus. Normal compressibility. Venous Reflux:  None.  Other Findings:  None. LEFT LOWER EXTREMITY Common Femoral Vein: No evidence of thrombus. Normal compressibility, respiratory phasicity and response to augmentation. Saphenofemoral Junction: No evidence of thrombus. Normal compressibility and flow on color Doppler imaging. Profunda Femoral Vein: No evidence of thrombus. Normal compressibility and flow on color Doppler imaging. Femoral Vein: No evidence of thrombus. Normal compressibility, respiratory phasicity and response to augmentation. Popliteal Vein: No evidence of thrombus. Normal compressibility, respiratory phasicity and response to augmentation. Calf Veins: Not well visualized. Superficial Great Saphenous Vein: No evidence of thrombus. Normal compressibility. Venous Reflux:  None. Other Findings:  None. IMPRESSION: No definitive deep venous thrombosis is noted. The calf veins are not well visualized bilaterally. Electronically Signed   By: Inez Catalina M.D.   On: 07/20/2021 00:06   ECHOCARDIOGRAM COMPLETE  Result Date: 07/20/2021    ECHOCARDIOGRAM REPORT   Patient Name:   ROXANA LAI Date of Exam: 07/20/2021 Medical Rec #:  [id number]         Height:       69.0 in Accession #:    [id number]        Weight:       270.0 lb Date of Birth:  Nov 15, 1955        BSA:          2.347 m Patient Age:    66 years          BP:           150/64 mmHg Patient Gender: F                 HR:           67 bpm. Exam Location:  [facility] Procedure: 2D Echo and Intracardiac Opacification Agent Indications:     Dyspnea R06.00  History:         Patient has no prior history of Echocardiogram examinations.  Sonographer:     Kathlen Brunswick RDCS Referring Phys:  [id number] AMY N COX Diagnosing Phys: Nelva Bush MD  Sonographer Comments: Technically difficult study due to poor echo windows and patient is morbidly obese. Image acquisition challenging due to patient body habitus. IMPRESSIONS  1. Left ventricular ejection fraction, by estimation, is 60 to 65%. The left ventricle has  normal function. The left ventricle has no regional wall motion abnormalities. There is moderate left ventricular hypertrophy. Left ventricular diastolic parameters are consistent with Grade I diastolic dysfunction (impaired relaxation).  2. Right ventricular systolic function is normal. The right ventricular size is normal. Tricuspid regurgitation signal is inadequate for assessing PA pressure.  3. The mitral valve is normal in structure. Trivial mitral valve regurgitation. No evidence of mitral stenosis.  4. The aortic valve is tricuspid. Aortic valve regurgitation is not visualized. No aortic stenosis is present. FINDINGS  Left Ventricle: Left ventricular ejection fraction, by estimation, is 60 to 65%. The left ventricle has normal function. The left ventricle has no regional wall motion abnormalities. Definity contrast agent was given IV to delineate the left ventricular  endocardial borders. The left ventricular internal cavity size was normal in size. There is moderate left ventricular hypertrophy. Left ventricular diastolic parameters  are consistent with Grade I diastolic dysfunction (impaired relaxation). Right Ventricle: The right ventricular size is normal. No increase in right ventricular wall thickness. Right ventricular systolic function is normal. Tricuspid regurgitation signal is inadequate for assessing PA pressure. Left Atrium: Left atrial size was normal in size. Right Atrium: Right atrial size was normal in size. Pericardium: The pericardium was not well visualized. Mitral Valve: The mitral valve is normal in structure. Trivial mitral valve regurgitation. No evidence of mitral valve stenosis. Tricuspid Valve: The tricuspid valve is not well visualized. Tricuspid valve regurgitation is not demonstrated. Aortic Valve: The aortic valve is tricuspid. Aortic valve regurgitation is not visualized. No aortic stenosis is present. Aortic valve peak gradient measures 4.8 mmHg. Pulmonic Valve: The pulmonic  valve was grossly normal. Pulmonic valve regurgitation is not visualized. No evidence of pulmonic stenosis. Aorta: The aortic root and ascending aorta are structurally normal, with no evidence of dilitation. Pulmonary Artery: The pulmonary artery is not well seen. IAS/Shunts: The interatrial septum was not well visualized.  LEFT VENTRICLE PLAX 2D LVIDd:         5.20 cm      Diastology LVIDs:         3.35 cm      LV e' medial:    3.59 cm/s LV PW:         1.45 cm      LV E/e' medial:  13.1 LV IVS:        1.25 cm      LV e' lateral:   6.96 cm/s LVOT diam:     2.00 cm      LV E/e' lateral: 6.8 LV SV:         62 LV SV Index:   27 LVOT Area:     3.14 cm  LV Volumes (MOD) LV vol d, MOD A2C: 67.5 ml LV vol d, MOD A4C: 106.0 ml LV vol s, MOD A2C: 33.7 ml LV vol s, MOD A4C: 36.5 ml LV SV MOD A2C:     33.8 ml LV SV MOD A4C:     106.0 ml LV SV MOD BP:      48.2 ml LEFT ATRIUM             Index        RIGHT ATRIUM           Index LA diam:        3.70 cm 1.58 cm/m   RA Area:     13.50 cm LA Vol (A2C):   30.2 ml 12.87 ml/m  RA Volume:   34.70 ml  14.78 ml/m LA Vol (A4C):   50.0 ml 21.30 ml/m LA Biplane Vol: 40.0 ml 17.04 ml/m  AORTIC VALVE                 PULMONIC VALVE AV Area (Vmax): 2.91 cm     PV Vmax:       0.87 m/s AV Vmax:        110.00 cm/s  PV Peak grad:  3.0 mmHg AV Peak Grad:   4.8 mmHg LVOT Vmax:      102.00 cm/s LVOT Vmean:     59.200 cm/s LVOT VTI:       0.198 m  AORTA Ao Root diam: 3.00 cm Ao Asc diam:  3.00 cm MITRAL VALVE MV Area (PHT): 2.07 cm    SHUNTS MV Decel Time: 367 msec    Systemic VTI:  0.20 m MV E velocity: 47.10 cm/s  Systemic  Diam: 2.00 cm MV A velocity: 61.30 cm/s MV E/A ratio:  0.77 Christopher End MD Electronically signed by Nelva Bush MD Signature Date/Time: 07/20/2021/10:17:05 AM    Final    CT CHEST ABDOMEN PELVIS WO CONTRAST  Result Date: 07/19/2021 CLINICAL DATA:  Chest pain for 3 days, nausea, mild dizziness EXAM: CT CHEST, ABDOMEN AND PELVIS WITHOUT CONTRAST TECHNIQUE:  Multidetector CT imaging of the chest, abdomen and pelvis was performed following the standard protocol without IV contrast. Evaluation of the vascular structures as well as the abdominopelvic viscera is severely limited without IV contrast. RADIATION DOSE REDUCTION: This exam was performed according to the departmental dose-optimization program which includes automated exposure control, adjustment of the mA and/or kV according to patient size and/or use of iterative reconstruction technique. 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 Limited unenhanced imaging of the heart and great vessels demonstrates no pericardial fluid. Normal caliber of the thoracic aorta, with mild atherosclerosis of the aortic arch. Evaluation of the vascular lumen is limited without IV contrast. Mediastinum/Nodes: No enlarged mediastinal, hilar, or axillary lymph nodes. Thyroid gland, trachea, and esophagus demonstrate no significant findings. Lungs/Pleura: Scattered areas of ground-glass airspace disease are seen within the lower lung zones, greatest in the lower lobes. The appearance is most suggestive of nonspecific inflammation or infection, including atypical infection such as COVID-19. No effusion or pneumothorax. 5 mm right middle lobe pulmonary nodule reference image 67/4. No other pulmonary nodules or masses. Central airways are widely patent. Musculoskeletal: No acute or destructive bony lesions. Reconstructed images demonstrate no additional findings. CT ABDOMEN PELVIS FINDINGS Hepatobiliary: Unremarkable unenhanced appearance of the liver and gallbladder. Pancreas: Unremarkable unenhanced appearance. Spleen: Unremarkable unenhanced appearance. Adrenals/Urinary Tract: No urinary tract calculi or obstructive uropathy within either kidney. 2.4 cm cyst lower pole right kidney. The adrenals are unremarkable. The bladder is decompressed, limiting its  evaluation. Stomach/Bowel: No bowel obstruction or ileus. Normal appendix right lower quadrant. No bowel wall thickening or inflammatory change. Vascular/Lymphatic: Evaluation of the vascular lumen is limited without IV contrast. Extensive atherosclerosis of the aorta and bilateral iliac vessels. No pathologic adenopathy within the abdomen or pelvis. Reproductive: Status post hysterectomy. No adnexal masses. Other: No free fluid or free gas.  No abdominal wall hernia. Musculoskeletal: No acute or destructive bony lesions. Reconstructed images demonstrate no additional findings. IMPRESSION: 1. Diffuse aortic atherosclerosis. Evaluation of the vascular lumen is limited without IV contrast. 2. Scattered bibasilar ground-glass airspace disease consistent with nonspecific infection or inflammation. Atypical infection such as COVID-19 can give this appearance. 3. 5 mm right solid pulmonary nodule. No routine follow-up imaging is recommended per Fleischner Society Guidelines. These guidelines do not apply to immunocompromised patients and patients with cancer. Follow up in patients with significant comorbidities as clinically warranted. For lung cancer screening, adhere to Lung-RADS guidelines. Reference: Radiology. 2017; 284(1):228-43. 4. No acute intra-abdominal or intrapelvic process. Electronically Signed   By: Randa Ngo M.D.   On: 07/19/2021 20:52        Scheduled Meds:  cholecalciferol  2,000 Units Oral Daily   cloNIDine  0.3 mg Oral BID   famotidine  20 mg Oral QHS   ferrous sulfate  325 mg Oral Q breakfast   gabapentin  100 mg Oral TID   heparin  5,000 Units Subcutaneous Q8H   hydrALAZINE  50 mg Oral TID   insulin aspart  0-20 Units Subcutaneous TID WC   insulin aspart  0-5 Units Subcutaneous QHS  insulin glargine-yfgn  16 Units Subcutaneous QHS   isosorbide mononitrate  60 mg Oral Daily   labetalol  20 mg Intravenous Once   levothyroxine  75 mcg Oral Q0600   metoprolol tartrate  12.5 mg  Oral Daily   Continuous Infusions:  niCARDipine Stopped (07/20/21 1110)     LOS: 0 days    Time spent: 50 minutes    Sidney Ace, MD [facility]   If 7PM-7AM, please contact night-coverage  07/20/2021, 1:49 PM

## 2021-07-21 LAB — CBG MONITORING, ED
Glucose-Capillary: 145 mg/dL — ABNORMAL HIGH (ref 70–99)
Glucose-Capillary: 192 mg/dL — ABNORMAL HIGH (ref 70–99)

## 2021-07-21 MED ORDER — HYDRALAZINE HCL 50 MG PO TABS: 50.0000 mg | ORAL_TABLET | Freq: Three times a day (TID) | ORAL | 0 refills | Status: AC

## 2021-07-21 NOTE — TOC Progression Note (Signed)
Transition of Care [facility]) - Progression Note    Patient Details  Name: Amanda Montgomery MRN: [id number] Date of Birth: 1955-07-24  Transition of Care [facility]) CM/SW [place], [place] Phone Number: 07/21/2021, 2:00 PM  Clinical Narrative:     Patient presents to [facility] ED due to centralized chest pain, patient's BP was 235/98.  Patient has history of hypertension, hyperlipidemia, insulin-dependent diabetes mellitus, hypothyroid, GERD, neuropathy, iron deficiency anemia, who presents to the emergency department for chief concerns of chest pain. Pt/OT met with patient for assessment, patient is completely independent with all ADLs and stated she does have needs at this time.  Patietn will d/c home. No TOC needs.  Expected Discharge Plan: Home/Self Care Barriers to Discharge: No Barriers Identified  Expected Discharge Plan and Services Expected Discharge Plan: Home/Self Care In-house Referral: Clinical Social Work   Post Acute Care Choice: NA Living arrangements for the past 2 months: Single Family Home Expected Discharge Date: 07/21/21                                     Social Determinants of Health (SDOH) Interventions    Readmission Risk Interventions No flowsheet data found.

## 2021-07-21 NOTE — ED Notes (Signed)
Pt resting. Pt states shes fine

## 2021-07-21 NOTE — Progress Notes (Signed)
PT Cancellation Note  Patient Details Name: Amanda Montgomery MRN: [id number] DOB: 09/13/1955   Cancelled Treatment:    Reason Eval/Treat Not Completed: PT screened, no needs identified, will sign off PT orders received, chart reviewed. Per nurse pt is ambulating & toileting in room without assistance. Spoke with pt who reports no concerns re: mobility. No acute PT needs identified at this time, PT to sign off.  Lavone Nian, PT, DPT 07/21/21, 9:46 AM   Waunita Schooner 07/21/2021, 9:45 AM

## 2021-07-21 NOTE — Discharge Summary (Signed)
Physician Discharge Summary  Amanda Montgomery:[id number] DOB: [birth year] DOA: 07/19/2021  PCP: Pcp, No  Admit date: 07/19/2021 Discharge date: 07/21/2021  Admitted From: Home Disposition: Home  Recommendations for Outpatient Follow-up:  Follow up with PCP in 1-2 weeks Follow-up neurology 2 weeks  Home Health: No Equipment/Devices: None  Discharge Condition: Stable CODE STATUS: Full Diet recommendation: Heart healthy  Brief/Interim Summary: 66 year old female with history of hypertension, hyperlipidemia, insulin-dependent diabetes mellitus, hypothyroid, GERD, neuropathy, iron deficiency anemia, who presents to the emergency department for chief concerns of chest pain.   Initial vitals in the emergency department showed temperature 98.1, respiration rate of 20, heart rate of 62, blood pressure 235 over 98, improved to 192/92, respiration rate with 99% on room air.  Labs in the ED showed serum sodium 138, potassium 4.9, chloride 107, bicarb 22, BUN of 57, serum creatinine of 4.31, GFR of 11, nonfasting blood glucose 145, WBC was 6.7, hemoglobin 9.4, platelets of 204.  Home dose of hydralazine increased from 25 3 times daily to 50 3 times daily.  This is the result in improved blood pressure control.  Seen in consultation by nephrology.  No changes in home medication regimen was made.  No further inpatient evaluation necessary from nephrology standpoint.  As blood pressure has improved and chest pain resolved patient stable for discharge at this time.    Discharge Diagnoses:  Principal Problem:   Hypertensive urgency Active Problems:   Chest pain   Type 2 diabetes mellitus with stage 4 chronic kidney disease (HCC)   CKD (chronic kidney disease) stage 4, GFR 15-29 ml/min (HCC)   AKI (acute kidney injury) (HCC)   Dyspnea on exertion   Swelling of lower extremity   OSA on CPAP  * Hypertensive urgency Assessment & Plan - Resumed home antihypertensives including clonidine 0.3  mg p.o. twice daily, hydralazine 25 mg p.o. 3 times daily, isosorbide mononitrate 60 mg daily, metoprolol tartrate 12.5 mg p.o. twice daily - Discontinued nitroglycerin gtt. - Increase home dose of hydralazine to 50mg  TID -Plan: Discharge home.  Home dose hydralazine increased to 50 3 times daily.  Remainder for medications unchanged.  Outpatient follow-up with PCP     Endocrine Type 2 diabetes mellitus with stage 4 chronic kidney disease (HCC) Assessment & Plan - Insulin-dependent - Can resume home regimen at discharge     Genitourinary AKI (acute kidney injury) on CKD stage IV Assessment & Plan - Presumed secondary to cardiorenal - Could be progressiion of CKD IV - Nephrology consulted -Renal ultrasound ordered.  No hydronephrosis.  No changes made in medication regimen.  Stable for discharge from nephrology standpoint.  Patient needs to follow-up with her nephrology service   Other Swelling of lower extremity Assessment & Plan - Left greater than the right - With bilateral lower extremity pain - Ultrasound of the lower extremity ordered to assess for DVT: NEGATIVE   Dyspnea on exertion Assessment & Plan - Differentials include new onset heart failure exacerbation - Check stat BNP, furosemide 40 mg IV one-time dose ordered - Complete echo ordered, EF 60 to 65% with grade 1 diastolic dysfunction - Can resume home regimen on discharge with outpatient PCP and potentially cardiology follow-up   Chest pain Assessment & Plan - Resolved at this time - Low suspcion for ACS - Suspect secondary to hypertensive urgency  Discharge Instructions  Discharge Instructions     Diet - low sodium heart healthy   Complete by: As directed    Increase activity slowly  Complete by: As directed       Allergies as of 07/21/2021       Reactions   Amlodipine Swelling   Legs swell Legs swelling   Atorvastatin Other (See Comments)   Leg cramps Leg cramps Other reaction(s): Leg  cramps, Unknown Leg cramps Leg cramps   Shrimp Extract Allergy Skin Test    Makes lips tingle Makes lips tingle Other reaction(s): Other (See Comments) Mouth and tongue start tingling  Makes lips tingle Makes lips tingle Makes lips tingle   Sulfa Antibiotics         Medication List     STOP taking these medications    clopidogrel 75 MG tablet Commonly known as: PLAVIX   cyclobenzaprine 5 MG tablet Commonly known as: FLEXERIL   lisinopril 20 MG tablet Commonly known as: ZESTRIL   pantoprazole 40 MG tablet Commonly known as: PROTONIX   rosuvastatin 10 MG tablet Commonly known as: CRESTOR   sodium bicarbonate 650 MG tablet       TAKE these medications    acetaminophen 325 MG tablet Commonly known as: TYLENOL Take 325-650 mg by mouth every 6 (six) hours as needed for pain.   albuterol 108 (90 Base) MCG/ACT inhaler Commonly known as: VENTOLIN HFA Inhale 2 puffs into the lungs every 4 (four) hours as needed for shortness of breath or wheezing.   aspirin 81 MG EC tablet Take 81 mg by mouth daily.   Cholecalciferol 50 MCG (2000 UT) Caps Take 2,000 Units by mouth daily.   cloNIDine 0.3 MG tablet Commonly known as: Catapres Take 1 tablet (0.3 mg total) by mouth 2 (two) times daily.   famotidine 20 MG tablet Commonly known as: PEPCID Take 20 mg by mouth at bedtime.   famotidine 20 MG tablet Commonly known as: PEPCID Take 20 mg by mouth every evening.   ferrous sulfate 325 (65 FE) MG tablet Take 325 mg by mouth daily with breakfast.   furosemide 40 MG tablet Commonly known as: LASIX Take 40 mg by mouth 2 (two) times daily.   gabapentin 100 MG capsule Commonly known as: NEURONTIN Take 100 mg by mouth 3 (three) times daily.   hydrALAZINE 50 MG tablet Commonly known as: APRESOLINE Take 1 tablet (50 mg total) by mouth 3 (three) times daily. What changed:  medication strength how much to take   insulin degludec 100 UNIT/ML FlexTouch Pen Commonly  known as: TRESIBA Inject 16 Units into the skin at bedtime.   insulin lispro 100 UNIT/ML KwikPen Commonly known as: HUMALOG Inject 20 Units into the skin 3 (three) times daily with meals. (according to sliding scale)   insulin lispro 100 UNIT/ML KwikPen Commonly known as: HUMALOG Inject 0-40 Units into the skin 3 (three) times daily.   isosorbide mononitrate 60 MG 24 hr tablet Commonly known as: IMDUR Take 60 mg by mouth daily.   levothyroxine 75 MCG tablet Commonly known as: SYNTHROID Take 75 mcg by mouth daily.   metoprolol tartrate 25 MG tablet Commonly known as: LOPRESSOR Take 12.5 mg by mouth daily.   nitroGLYCERIN 0.4 MG SL tablet Commonly known as: NITROSTAT Place 0.4 mg under the tongue every 5 (five) minutes x 3 doses as needed for chest pain.   senna 8.6 MG tablet Commonly known as: SENOKOT Take 8.6 mg by mouth every morning.   sucralfate 1 g tablet Commonly known as: CARAFATE Take 1 g by mouth 4 (four) times daily -  before meals and at bedtime.  Allergies  Allergen Reactions   Amlodipine Swelling    Legs swell Legs swelling    Atorvastatin Other (See Comments)    Leg cramps Leg cramps  Other reaction(s): Leg cramps, Unknown Leg cramps Leg cramps    Shrimp Extract Allergy Skin Test     Makes lips tingle Makes lips tingle  Other reaction(s): Other (See Comments) Mouth and tongue start tingling  Makes lips tingle Makes lips tingle Makes lips tingle    Sulfa Antibiotics     Consultations: Nephrology   Procedures/Studies: DG Chest 2 View  Result Date: 07/19/2021 CLINICAL DATA:  Centralized chest pain, nausea, shortness of breath EXAM: CHEST - 2 VIEW COMPARISON:  02/07/2021 FINDINGS: Frontal and lateral views of the chest demonstrate 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 Cardiac silhouette is unremarkable. No acute airspace disease, effusion, or pneumothorax. There are no acute bony abnormalities. IMPRESSION: 1. No acute  intrathoracic process. Electronically Signed   By: Randa Ngo M.D.   On: 07/19/2021 18:56   CT HEAD WO CONTRAST (5MM)  Result Date: 07/19/2021 CLINICAL DATA:  Hypertensive emergency hypertensive urgency, sbp 235 EXAM: CT HEAD WITHOUT CONTRAST TECHNIQUE: Contiguous axial images were obtained from the base of the skull through the vertex without intravenous contrast. RADIATION DOSE REDUCTION: This exam was performed according to the departmental dose-optimization program which includes automated exposure control, adjustment of the mA and/or kV according to patient size and/or use of iterative reconstruction technique. COMPARISON:  CT head 04/12/2021 FINDINGS: Brain: No evidence of large-territorial acute infarction. No parenchymal hemorrhage. No mass lesion. No extra-axial collection. No mass effect or midline shift. No hydrocephalus. Basilar cisterns are patent. Vascular: No hyperdense vessel. Atherosclerotic calcifications are present within the cavernous internal carotid arteries. Skull: No acute fracture or focal lesion. Sinuses/Orbits: Paranasal sinuses and mastoid air cells are clear. The orbits are unremarkable. Other: None. IMPRESSION: No acute intracranial abnormality. Electronically Signed   By: Iven Finn M.D.   On: 07/19/2021 22:49   US RENAL  Result Date: 07/20/2021 CLINICAL DATA:  Acute tubular necrosis. EXAM: RENAL / URINARY TRACT ULTRASOUND COMPLETE COMPARISON:  None. FINDINGS: Right Kidney: Renal measurements: 10.1 x 5.7 x 4.9 cm = volume: 149 mL. Increased echogenicity renal parenchyma is noted suggesting medical renal disease. 2.5 cm exophytic simple cyst is noted. No mass or hydronephrosis visualized. Left Kidney: Renal measurements: 12.1 x 5.8 x 5.5 cm = volume: 199 mL. Echogenicity within normal limits. No mass or hydronephrosis visualized. Bladder: Appears normal for degree of bladder distention. Other: None. IMPRESSION: Normal left kidney. No hydronephrosis or renal obstruction  is noted. Increased echogenicity of right renal parenchyma is noted suggesting medical renal disease. Electronically Signed   By: Marijo Conception M.D.   On: 07/20/2021 17:51   US Venous Img Lower Bilateral (DVT)  Result Date: 07/20/2021 CLINICAL DATA:  Bilateral lower extremity pain and swelling EXAM: BILATERAL LOWER EXTREMITY VENOUS DOPPLER ULTRASOUND TECHNIQUE: Gray-scale sonography with graded compression, as well as color Doppler and duplex ultrasound were performed to evaluate the lower extremity deep venous systems from the level of the common femoral vein and including the common femoral, femoral, profunda femoral, popliteal and calf veins including the posterior tibial, peroneal and gastrocnemius veins when visible. The superficial great saphenous vein was also interrogated. Spectral Doppler was utilized to evaluate flow at rest and with distal augmentation maneuvers in the common femoral, femoral and popliteal veins. COMPARISON:  None. FINDINGS: RIGHT LOWER EXTREMITY Common Femoral Vein: No evidence of thrombus. Normal compressibility, respiratory phasicity and  response to augmentation. Saphenofemoral Junction: No evidence of thrombus. Normal compressibility and flow on color Doppler imaging. Profunda Femoral Vein: No evidence of thrombus. Normal compressibility and flow on color Doppler imaging. Femoral Vein: No evidence of thrombus. Normal compressibility, respiratory phasicity and response to augmentation. Popliteal Vein: No evidence of thrombus. Normal compressibility, respiratory phasicity and response to augmentation. Calf Veins: Not well visualized Superficial Great Saphenous Vein: No evidence of thrombus. Normal compressibility. Venous Reflux:  None. Other Findings:  None. LEFT LOWER EXTREMITY Common Femoral Vein: No evidence of thrombus. Normal compressibility, respiratory phasicity and response to augmentation. Saphenofemoral Junction: No evidence of thrombus. Normal compressibility and flow  on color Doppler imaging. Profunda Femoral Vein: No evidence of thrombus. Normal compressibility and flow on color Doppler imaging. Femoral Vein: No evidence of thrombus. Normal compressibility, respiratory phasicity and response to augmentation. Popliteal Vein: No evidence of thrombus. Normal compressibility, respiratory phasicity and response to augmentation. Calf Veins: Not well visualized. Superficial Great Saphenous Vein: No evidence of thrombus. Normal compressibility. Venous Reflux:  None. Other Findings:  None. IMPRESSION: No definitive deep venous thrombosis is noted. The calf veins are not well visualized bilaterally. Electronically Signed   By: Inez Catalina M.D.   On: 07/20/2021 00:06   ECHOCARDIOGRAM COMPLETE  Result Date: 07/20/2021    ECHOCARDIOGRAM REPORT   Patient Name:   MEIGHAN TRETO Date of Exam: 07/20/2021 Medical Rec #:  [id number]         Height:       69.0 in Accession #:    [id number]        Weight:       270.0 lb Date of Birth:  [birth year]        BSA:          2.347 m Patient Age:    38 years          BP:           150/64 mmHg Patient Gender: F                 HR:           67 bpm. Exam Location:  [facility] Procedure: 2D Echo and Intracardiac Opacification Agent Indications:     Dyspnea R06.00  History:         Patient has no prior history of Echocardiogram examinations.  Sonographer:     Kathlen Brunswick RDCS Referring Phys:  [id number] AMY N COX Diagnosing Phys: Nelva Bush MD  Sonographer Comments: Technically difficult study due to poor echo windows and patient is morbidly obese. Image acquisition challenging due to patient body habitus. IMPRESSIONS  1. Left ventricular ejection fraction, by estimation, is 60 to 65%. The left ventricle has normal function. The left ventricle has no regional wall motion abnormalities. There is moderate left ventricular hypertrophy. Left ventricular diastolic parameters are consistent with Grade I diastolic dysfunction (impaired relaxation).  2. Right  ventricular systolic function is normal. The right ventricular size is normal. Tricuspid regurgitation signal is inadequate for assessing PA pressure.  3. The mitral valve is normal in structure. Trivial mitral valve regurgitation. No evidence of mitral stenosis.  4. The aortic valve is tricuspid. Aortic valve regurgitation is not visualized. No aortic stenosis is present. FINDINGS  Left Ventricle: Left ventricular ejection fraction, by estimation, is 60 to 65%. The left ventricle has normal function. The left ventricle has no regional wall motion abnormalities. Definity contrast agent was given IV to delineate the left ventricular  endocardial borders. The left  ventricular internal cavity size was normal in size. There is moderate left ventricular hypertrophy. Left ventricular diastolic parameters are consistent with Grade I diastolic dysfunction (impaired relaxation). Right Ventricle: The right ventricular size is normal. No increase in right ventricular wall thickness. Right ventricular systolic function is normal. Tricuspid regurgitation signal is inadequate for assessing PA pressure. Left Atrium: Left atrial size was normal in size. Right Atrium: Right atrial size was normal in size. Pericardium: The pericardium was not well visualized. Mitral Valve: The mitral valve is normal in structure. Trivial mitral valve regurgitation. No evidence of mitral valve stenosis. Tricuspid Valve: The tricuspid valve is not well visualized. Tricuspid valve regurgitation is not demonstrated. Aortic Valve: The aortic valve is tricuspid. Aortic valve regurgitation is not visualized. No aortic stenosis is present. Aortic valve peak gradient measures 4.8 mmHg. Pulmonic Valve: The pulmonic valve was grossly normal. Pulmonic valve regurgitation is not visualized. No evidence of pulmonic stenosis. Aorta: The aortic root and ascending aorta are structurally normal, with no evidence of dilitation. Pulmonary Artery: The pulmonary artery is  not well seen. IAS/Shunts: The interatrial septum was not well visualized.  LEFT VENTRICLE PLAX 2D LVIDd:         5.20 cm      Diastology LVIDs:         3.35 cm      LV e' medial:    3.59 cm/s LV PW:         1.45 cm      LV E/e' medial:  13.1 LV IVS:        1.25 cm      LV e' lateral:   6.96 cm/s LVOT diam:     2.00 cm      LV E/e' lateral: 6.8 LV SV:         62 LV SV Index:   27 LVOT Area:     3.14 cm  LV Volumes (MOD) LV vol d, MOD A2C: 67.5 ml LV vol d, MOD A4C: 106.0 ml LV vol s, MOD A2C: 33.7 ml LV vol s, MOD A4C: 36.5 ml LV SV MOD A2C:     33.8 ml LV SV MOD A4C:     106.0 ml LV SV MOD BP:      48.2 ml LEFT ATRIUM             Index        RIGHT ATRIUM           Index LA diam:        3.70 cm 1.58 cm/m   RA Area:     13.50 cm LA Vol (A2C):   30.2 ml 12.87 ml/m  RA Volume:   34.70 ml  14.78 ml/m LA Vol (A4C):   50.0 ml 21.30 ml/m LA Biplane Vol: 40.0 ml 17.04 ml/m  AORTIC VALVE                 PULMONIC VALVE AV Area (Vmax): 2.91 cm     PV Vmax:       0.87 m/s AV Vmax:        110.00 cm/s  PV Peak grad:  3.0 mmHg AV Peak Grad:   4.8 mmHg LVOT Vmax:      102.00 cm/s LVOT Vmean:     59.200 cm/s LVOT VTI:       0.198 m  AORTA Ao Root diam: 3.00 cm Ao Asc diam:  3.00 cm MITRAL VALVE MV Area (PHT): 2.07 cm    SHUNTS MV Decel  Time: 367 msec    Systemic VTI:  0.20 m MV E velocity: 47.10 cm/s  Systemic Diam: 2.00 cm MV A velocity: 61.30 cm/s MV E/A ratio:  0.77 Christopher End MD Electronically signed by Nelva Bush MD Signature Date/Time: 07/20/2021/10:17:05 AM    Final    CT CHEST ABDOMEN PELVIS WO CONTRAST  Result Date: 07/19/2021 CLINICAL DATA:  Chest pain for 3 days, nausea, mild dizziness EXAM: CT CHEST, ABDOMEN AND PELVIS WITHOUT CONTRAST TECHNIQUE: Multidetector CT imaging of the chest, abdomen and pelvis was performed following the standard protocol without IV contrast. Evaluation of the vascular structures as well as the abdominopelvic viscera is severely limited without IV contrast. RADIATION DOSE  REDUCTION: This exam was performed according to the departmental dose-optimization program which includes automated exposure control, adjustment of the mA and/or kV according to patient size and/or use of iterative reconstruction technique. 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 Limited unenhanced imaging of the heart and great vessels demonstrates no pericardial fluid. Normal caliber of the thoracic aorta, with mild atherosclerosis of the aortic arch. Evaluation of the vascular lumen is limited without IV contrast. Mediastinum/Nodes: No enlarged mediastinal, hilar, or axillary lymph nodes. Thyroid gland, trachea, and esophagus demonstrate no significant findings. Lungs/Pleura: Scattered areas of ground-glass airspace disease are seen within the lower lung zones, greatest in the lower lobes. The appearance is most suggestive of nonspecific inflammation or infection, including atypical infection such as COVID-19. No effusion or pneumothorax. 5 mm right middle lobe pulmonary nodule reference image 67/4. No other pulmonary nodules or masses. Central airways are widely patent. Musculoskeletal: No acute or destructive bony lesions. Reconstructed images demonstrate no additional findings. CT ABDOMEN PELVIS FINDINGS Hepatobiliary: Unremarkable unenhanced appearance of the liver and gallbladder. Pancreas: Unremarkable unenhanced appearance. Spleen: Unremarkable unenhanced appearance. Adrenals/Urinary Tract: No urinary tract calculi or obstructive uropathy within either kidney. 2.4 cm cyst lower pole right kidney. The adrenals are unremarkable. The bladder is decompressed, limiting its evaluation. Stomach/Bowel: No bowel obstruction or ileus. Normal appendix right lower quadrant. No bowel wall thickening or inflammatory change. Vascular/Lymphatic: Evaluation of the vascular lumen is limited without IV contrast. Extensive atherosclerosis  of the aorta and bilateral iliac vessels. No pathologic adenopathy within the abdomen or pelvis. Reproductive: Status post hysterectomy. No adnexal masses. Other: No free fluid or free gas.  No abdominal wall hernia. Musculoskeletal: No acute or destructive bony lesions. Reconstructed images demonstrate no additional findings. IMPRESSION: 1. Diffuse aortic atherosclerosis. Evaluation of the vascular lumen is limited without IV contrast. 2. Scattered bibasilar ground-glass airspace disease consistent with nonspecific infection or inflammation. Atypical infection such as COVID-19 can give this appearance. 3. 5 mm right solid pulmonary nodule. No routine follow-up imaging is recommended per Fleischner Society Guidelines. These guidelines do not apply to immunocompromised patients and patients with cancer. Follow up in patients with significant comorbidities as clinically warranted. For lung cancer screening, adhere to Lung-RADS guidelines. Reference: Radiology. 2017; 284(1):228-43. 4. No acute intra-abdominal or intrapelvic process. Electronically Signed   By: Randa Ngo M.D.   On: 07/19/2021 20:52      Subjective: Seen and examined at the day of discharge.  Stable no distress.  Chest pain resolved.  Blood pressure control improved.  Creatinine trending down.  Stable for discharge home.  Discharge Exam: Vitals:   07/21/21 1200 07/21/21 1221  BP: (!) 158/66   Pulse: (!) 42 (!) 51  Resp: 13 (!) 23  Temp:    SpO2:  99% 100%   Vitals:   07/21/21 0841 07/21/21 1053 07/21/21 1200 07/21/21 1221  BP: 140/60 (!) 144/64 (!) 158/66   Pulse:   (!) 42 (!) 51  Resp:   13 (!) 23  Temp:      SpO2:   99% 100%  Weight:      Height:        General: Pt is alert, awake, not in acute distress Cardiovascular: RRR, S1/S2 +, no rubs, no gallops Respiratory: CTA bilaterally, no wheezing, no rhonchi Abdominal: Soft, NT, ND, bowel sounds + Extremities: no edema, no cyanosis    The results of significant  diagnostics from this hospitalization (including imaging, microbiology, ancillary and laboratory) are listed below for reference.     Microbiology: Recent Results (from the past 240 hour(s))  Resp Panel by RT-PCR (Flu A&B, Covid) Nasopharyngeal Swab     Status: None   Collection Time: 07/19/21  8:54 PM   Specimen: Nasopharyngeal Swab; Nasopharyngeal(NP) swabs in vial transport medium  Result Value Ref Range Status   SARS Coronavirus 2 by RT PCR NEGATIVE NEGATIVE Final    Comment: (NOTE) SARS-CoV-2 target nucleic acids are NOT DETECTED.  The SARS-CoV-2 RNA is generally detectable in upper respiratory specimens during the acute phase of infection. The lowest concentration of SARS-CoV-2 viral copies this assay can detect is 138 copies/mL. A negative result does not preclude SARS-Cov-2 infection and should not be used as the sole basis for treatment or other patient management decisions. A negative result may occur with  improper specimen collection/handling, submission of specimen other than nasopharyngeal swab, presence of viral mutation(s) within the areas targeted by this assay, and inadequate number of viral copies(<138 copies/mL). A negative result must be combined with clinical observations, patient history, and epidemiological information. The expected result is Negative.  Fact Sheet for Patients:  EntrepreneurPulse.com.au  Fact Sheet for Healthcare Providers:  IncredibleEmployment.be  This test is no t yet approved or cleared by the [place] FDA and  has been authorized for detection and/or diagnosis of SARS-CoV-2 by FDA under an Emergency Use Authorization (EUA). This EUA will remain  in effect (meaning this test can be used) for the duration of the COVID-19 declaration under Section 564(b)(1) of the Act, 21 U.S.C.section 360bbb-3(b)(1), unless the authorization is terminated  or revoked sooner.       Influenza A by PCR NEGATIVE  NEGATIVE Final   Influenza B by PCR NEGATIVE NEGATIVE Final    Comment: (NOTE) The Xpert Xpress SARS-CoV-2/FLU/RSV plus assay is intended as an aid in the diagnosis of influenza from Nasopharyngeal swab specimens and should not be used as a sole basis for treatment. Nasal washings and aspirates are unacceptable for Xpert Xpress SARS-CoV-2/FLU/RSV testing.  Fact Sheet for Patients: EntrepreneurPulse.com.au  Fact Sheet for Healthcare Providers: IncredibleEmployment.be  This test is not yet approved or cleared by the [place] FDA and has been authorized for detection and/or diagnosis of SARS-CoV-2 by FDA under an Emergency Use Authorization (EUA). This EUA will remain in effect (meaning this test can be used) for the duration of the COVID-19 declaration under Section 564(b)(1) of the Act, 21 U.S.C. section 360bbb-3(b)(1), unless the authorization is terminated or revoked.  Performed at [facility], [address] [place], [place] [address]      Labs: BNP (last 3 results) Recent Labs    08/19/20 1830 07/19/21 1934  BNP 43.3 737.1*   Basic Metabolic Panel: Recent Labs  Lab 07/19/21 1934 07/20/21 0736  NA 138 138  K 4.9 4.8  CL 107 111  CO2 22 20*  GLUCOSE 145* 159*  BUN 57* 54*  CREATININE 4.31* 4.29*  CALCIUM 8.5* 8.4*   Liver Function Tests: Recent Labs  Lab 07/19/21 1934  AST 23  ALT 27  ALKPHOS 72  BILITOT 0.8  PROT 6.9  ALBUMIN 2.9*   No results for input(s): LIPASE, AMYLASE in the last 168 hours. No results for input(s): AMMONIA in the last 168 hours. CBC: Recent Labs  Lab 07/19/21 1934 07/20/21 0736  WBC 6.7 6.9  HGB 9.4* 9.6*  HCT 28.7* 28.9*  MCV 89.4 88.1  PLT 204 193   Cardiac Enzymes: No results for input(s): CKTOTAL, CKMB, CKMBINDEX, TROPONINI in the last 168 hours. BNP: Invalid input(s): POCBNP CBG: Recent Labs  Lab 07/20/21 1154 07/20/21 1649 07/20/21 2105 07/21/21 0810  07/21/21 1219  GLUCAP 182* 222* 153* 145* 192*   D-Dimer Recent Labs    07/19/21 1934  DDIMER 1.19*   Hgb A1c No results for input(s): HGBA1C in the last 72 hours. Lipid Profile No results for input(s): CHOL, HDL, LDLCALC, TRIG, CHOLHDL, LDLDIRECT in the last 72 hours. Thyroid function studies No results for input(s): TSH, T4TOTAL, T3FREE, THYROIDAB in the last 72 hours.  Invalid input(s): FREET3 Anemia work up Recent Labs    07/20/21 0736  FOLATE 16.0  FERRITIN 164  TIBC 252  IRON 32   Urinalysis    Component Value Date/Time   COLORURINE STRAW (A) 04/12/2021 0801   APPEARANCEUR CLEAR (A) 04/12/2021 0801   LABSPEC 1.011 04/12/2021 0801   PHURINE 6.0 04/12/2021 0801   GLUCOSEU 50 (A) 04/12/2021 0801   HGBUR SMALL (A) 04/12/2021 0801   BILIRUBINUR NEGATIVE 04/12/2021 0801   KETONESUR NEGATIVE 04/12/2021 0801   PROTEINUR 100 (A) 04/12/2021 0801   NITRITE NEGATIVE 04/12/2021 0801   LEUKOCYTESUR NEGATIVE 04/12/2021 0801   Sepsis Labs Invalid input(s): PROCALCITONIN,  WBC,  LACTICIDVEN Microbiology Recent Results (from the past 240 hour(s))  Resp Panel by RT-PCR (Flu A&B, Covid) Nasopharyngeal Swab     Status: None   Collection Time: 07/19/21  8:54 PM   Specimen: Nasopharyngeal Swab; Nasopharyngeal(NP) swabs in vial transport medium  Result Value Ref Range Status   SARS Coronavirus 2 by RT PCR NEGATIVE NEGATIVE Final    Comment: (NOTE) SARS-CoV-2 target nucleic acids are NOT DETECTED.  The SARS-CoV-2 RNA is generally detectable in upper respiratory specimens during the acute phase of infection. The lowest concentration of SARS-CoV-2 viral copies this assay can detect is 138 copies/mL. A negative result does not preclude SARS-Cov-2 infection and should not be used as the sole basis for treatment or other patient management decisions. A negative result may occur with  improper specimen collection/handling, submission of specimen other than nasopharyngeal swab,  presence of viral mutation(s) within the areas targeted by this assay, and inadequate number of viral copies(<138 copies/mL). A negative result must be combined with clinical observations, patient history, and epidemiological information. The expected result is Negative.  Fact Sheet for Patients:  EntrepreneurPulse.com.au  Fact Sheet for Healthcare Providers:  IncredibleEmployment.be  This test is no t yet approved or cleared by the [place] FDA and  has been authorized for detection and/or diagnosis of SARS-CoV-2 by FDA under an Emergency Use Authorization (EUA). This EUA will remain  in effect (meaning this test can be used) for the duration of the COVID-19 declaration under Section 564(b)(1) of the Act, 21 U.S.C.section 360bbb-3(b)(1), unless the authorization is terminated  or revoked sooner.  Influenza A by PCR NEGATIVE NEGATIVE Final   Influenza B by PCR NEGATIVE NEGATIVE Final    Comment: (NOTE) The Xpert Xpress SARS-CoV-2/FLU/RSV plus assay is intended as an aid in the diagnosis of influenza from Nasopharyngeal swab specimens and should not be used as a sole basis for treatment. Nasal washings and aspirates are unacceptable for Xpert Xpress SARS-CoV-2/FLU/RSV testing.  Fact Sheet for Patients: EntrepreneurPulse.com.au  Fact Sheet for Healthcare Providers: IncredibleEmployment.be  This test is not yet approved or cleared by the [place] FDA and has been authorized for detection and/or diagnosis of SARS-CoV-2 by FDA under an Emergency Use Authorization (EUA). This EUA will remain in effect (meaning this test can be used) for the duration of the COVID-19 declaration under Section 564(b)(1) of the Act, 21 U.S.C. section 360bbb-3(b)(1), unless the authorization is terminated or revoked.  Performed at [facility], [address] [place], [place] [address]      Time  coordinating discharge: Over 30 minutes  SIGNED:   Sidney Ace, MD  [facility] 07/21/2021, 1:55 PM Pager   If 7PM-7AM, please contact night-coverage

## 2021-07-21 NOTE — Progress Notes (Signed)
OT Screen Note  Patient Details Name: Amanda Montgomery MRN: [id number] DOB: March 19, 1956   Cancelled Treatment:    Reason Eval/Treat Not Completed: OT screened, no needs identified, will sign off. Consult received, chart reviewed. Pt seated EOB, denies complaints, and at baseline for ADL/mobility independence. No skilled acute OT needs at this time. Will sign off.   Ardeth Perfect., MPH, MS, OTR/L ascom [phone] 07/21/21, 12:16 PM

## 2021-07-21 NOTE — Progress Notes (Signed)
Amanda Montgomery  MRN: [id number]  DOB/AGE: 65-Aug-1957 65 y.o.  Primary Care Physician:Pcp, No  Admit date: 07/19/2021  Chief Complaint:  Chief Complaint  Patient presents with   Chest Pain    S-Pt presented on  07/19/2021 with  Chief Complaint  Patient presents with   Chest Pain  . Patient was seen in the ER. Patient still in the ER as she is still awaiting bed placement. Patient offers no new specific physical complaint.  Patient main questioning today visit was what was her kidney function. I then discussed with the patient about her current GFR.  Patient voiced understanding  Medications  cholecalciferol  2,000 Units Oral Daily   cloNIDine  0.3 mg Oral BID   famotidine  20 mg Oral QHS   ferrous sulfate  325 mg Oral Q breakfast   gabapentin  100 mg Oral TID   heparin  5,000 Units Subcutaneous Q8H   hydrALAZINE  50 mg Oral TID   insulin aspart  0-20 Units Subcutaneous TID WC   insulin aspart  0-5 Units Subcutaneous QHS   insulin glargine-yfgn  16 Units Subcutaneous QHS   isosorbide mononitrate  60 mg Oral Daily   labetalol  20 mg Intravenous Once   levothyroxine  75 mcg Oral Q0600   metoprolol tartrate  12.5 mg Oral Daily         [id number] from the symptoms mentioned above,there are no other symptoms referable to all systems reviewed.  Physical Exam: Vital signs in last 24 hours: Pulse Rate:  [47-107] 47 (01/29 0700) Resp:  [10-27] 17 (01/29 0700) BP: (123-179)/(50-90) 138/50 (01/29 0700) SpO2:  [97 %-100 %] 100 % (01/29 0700) Weight change:     Intake/Output from previous day: No intake/output data recorded. No intake/output data recorded.   Physical Exam:  General- pt is awake,alert, oriented to time place and person  Resp- No acute REsp distress, CTA B/L NO Rhonchi  CVS- S1S2 regular in rate and rhythm  GIT- BS+, soft, Non tender , Non distended  EXT- No LE Edema,  No Cyanosis    Lab Results:  CBC  Recent Labs    07/19/21 1934  07/20/21 0736  WBC 6.7 6.9  HGB 9.4* 9.6*  HCT 28.7* 28.9*  PLT 204 193    BMET  Recent Labs    07/19/21 1934 07/20/21 0736  NA 138 138  K 4.9 4.8  CL 107 111  CO2 22 20*  GLUCOSE 145* 159*  BUN 57* 54*  CREATININE 4.31* 4.29*  CALCIUM 8.5* 8.4*      Most recent Creatinine trend  Lab Results  Component Value Date   CREATININE 4.29 (H) 07/20/2021   CREATININE 4.31 (H) 07/19/2021   CREATININE 3.25 (H) 04/12/2021      MICRO   Recent Results (from the past 240 hour(s))  Resp Panel by RT-PCR (Flu A&B, Covid) Nasopharyngeal Swab     Status: None   Collection Time: 07/19/21  8:54 PM   Specimen: Nasopharyngeal Swab; Nasopharyngeal(NP) swabs in vial transport medium  Result Value Ref Range Status   SARS Coronavirus 2 by RT PCR NEGATIVE NEGATIVE Final    Comment: (NOTE) SARS-CoV-2 target nucleic acids are NOT DETECTED.  The SARS-CoV-2 RNA is generally detectable in upper respiratory specimens during the acute phase of infection. The lowest concentration of SARS-CoV-2 viral copies this assay can detect is 138 copies/mL. A negative result does not preclude SARS-Cov-2 infection and should not be used as the sole basis for treatment or other  patient management decisions. A negative result may occur with  improper specimen collection/handling, submission of specimen other than nasopharyngeal swab, presence of viral mutation(s) within the areas targeted by this assay, and inadequate number of viral copies(<138 copies/mL). A negative result must be combined with clinical observations, patient history, and epidemiological information. The expected result is Negative.  Fact Sheet for Patients:  EntrepreneurPulse.com.au  Fact Sheet for Healthcare Providers:  IncredibleEmployment.be  This test is no t yet approved or cleared by the [place] FDA and  has been authorized for detection and/or diagnosis of SARS-CoV-2 by FDA under an  Emergency Use Authorization (EUA). This EUA will remain  in effect (meaning this test can be used) for the duration of the COVID-19 declaration under Section 564(b)(1) of the Act, 21 U.S.C.section 360bbb-3(b)(1), unless the authorization is terminated  or revoked sooner.       Influenza A by PCR NEGATIVE NEGATIVE Final   Influenza B by PCR NEGATIVE NEGATIVE Final    Comment: (NOTE) The Xpert Xpress SARS-CoV-2/FLU/RSV plus assay is intended as an aid in the diagnosis of influenza from Nasopharyngeal swab specimens and should not be used as a sole basis for treatment. Nasal washings and aspirates are unacceptable for Xpert Xpress SARS-CoV-2/FLU/RSV testing.  Fact Sheet for Patients: EntrepreneurPulse.com.au  Fact Sheet for Healthcare Providers: IncredibleEmployment.be  This test is not yet approved or cleared by the [place] FDA and has been authorized for detection and/or diagnosis of SARS-CoV-2 by FDA under an Emergency Use Authorization (EUA). This EUA will remain in effect (meaning this test can be used) for the duration of the COVID-19 declaration under Section 564(b)(1) of the Act, 21 U.S.C. section 360bbb-3(b)(1), unless the authorization is terminated or revoked.  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   As an outpatient patient was on RAS blockers Agree with holding RAS blockers because of her AKI   Patient creatinine stable at 4.3 -I did discuss the patient about her kidney function/GFR    2)Hypertensive urgency Patient's blood pressure is stable   3)Anemia of chronic disease  CBC Latest Ref Rng & Units 07/20/2021 07/19/2021  04/12/2021  WBC 4.0 - 10.5 K/uL 6.9 6.7 6.2  Hemoglobin 12.0 - 15.0 g/dL 9.6(L) 9.4(L) 9.8(L)  Hematocrit 36.0 - 46.0 % 28.9(L) 28.7(L) 28.6(L)  Platelets 150 - 400 K/uL 193 204 225       HGb at goal (9--11)   4) Secondary hyperparathyroidism -CKD Mineral-Bone Disorder Patient has a history of secondary hyperparathyroidism with intact PTH at 235 and going back to April 22, 2021    Lab Results  Component Value Date   CALCIUM 8.4 (L) 07/20/2021      Phosphorus will check    5)Proteinuria Patient has had  proteinuria going back to 2020 Patient RAS blockers are on hold because of AKI   6) Electrolytes   BMP Latest Ref Rng & Units 07/20/2021 07/19/2021 04/12/2021  Glucose 70 - 99 mg/dL 159(H) 145(H) 136(H)  BUN 8 - 23 mg/dL 54(H) 57(H) 60(H)  Creatinine 0.44 - 1.00 mg/dL 4.29(H) 4.31(H) 3.25(H)  Sodium 135 - 145 mmol/L 138 138 138  Potassium 3.5 - 5.1 mmol/L  4.8 4.9 5.0  Chloride 98 - 111 mmol/L 111 107 105  CO2 22 - 32 mmol/L 20(L) 22 25  Calcium 8.9 - 10.3 mg/dL 8.4(L) 8.5(L) 8.8(L)     Sodium Normonatremic   Potassium Normokalemic    7)Acid base    Co2 at goal     Plan:  We will continue current treatment plan     Carver Murakami s Theador Hawthorne 07/21/2021, 8:40 AM

## 2021-07-22 LAB — HEMOGLOBIN A1C
Hgb A1c MFr Bld: 8.5 % — ABNORMAL HIGH (ref 4.8–5.6)
Mean Plasma Glucose: 197 mg/dL

## 2021-07-23 ENCOUNTER — Other Ambulatory Visit: Payer: Self-pay

## 2021-07-23 ENCOUNTER — Emergency Department
Admission: EM | Admit: 2021-07-23 | Discharge: 2021-07-23 | Disposition: A | Discharge status: Home / Self Care | Payer: 59 | Attending: Emergency Medicine | Admitting: Emergency Medicine

## 2021-07-23 ENCOUNTER — Emergency Department: Payer: 59

## 2021-07-23 DIAGNOSIS — N189 Chronic kidney disease, unspecified: Secondary | ICD-10-CM | POA: Insufficient documentation

## 2021-07-23 DIAGNOSIS — E039 Hypothyroidism, unspecified: Secondary | ICD-10-CM | POA: Diagnosis not present

## 2021-07-23 DIAGNOSIS — Z20822 Contact with and (suspected) exposure to covid-19: Secondary | ICD-10-CM | POA: Diagnosis not present

## 2021-07-23 DIAGNOSIS — E1122 Type 2 diabetes mellitus with diabetic chronic kidney disease: Secondary | ICD-10-CM | POA: Insufficient documentation

## 2021-07-23 DIAGNOSIS — R0602 Shortness of breath: Secondary | ICD-10-CM | POA: Diagnosis present

## 2021-07-23 DIAGNOSIS — I129 Hypertensive chronic kidney disease with stage 1 through stage 4 chronic kidney disease, or unspecified chronic kidney disease: Secondary | ICD-10-CM | POA: Diagnosis not present

## 2021-07-23 DIAGNOSIS — I10 Essential (primary) hypertension: Secondary | ICD-10-CM

## 2021-07-23 DIAGNOSIS — E1140 Type 2 diabetes mellitus with diabetic neuropathy, unspecified: Secondary | ICD-10-CM | POA: Insufficient documentation

## 2021-07-23 DIAGNOSIS — R0789 Other chest pain: Secondary | ICD-10-CM

## 2021-07-23 LAB — RESP PANEL BY RT-PCR (FLU A&B, COVID) ARPGX2
Influenza A by PCR: NEGATIVE
Influenza B by PCR: NEGATIVE
SARS Coronavirus 2 by RT PCR: NEGATIVE

## 2021-07-23 LAB — CBC WITH DIFFERENTIAL/PLATELET
Abs Immature Granulocytes: 0.04 10*3/uL (ref 0.00–0.07)
Basophils Absolute: 0 10*3/uL (ref 0.0–0.1)
Basophils Relative: 0 %
Eosinophils Absolute: 0.2 10*3/uL (ref 0.0–0.5)
Eosinophils Relative: 3 %
HCT: 32.6 % — ABNORMAL LOW (ref 36.0–46.0)
Hemoglobin: 10.8 g/dL — ABNORMAL LOW (ref 12.0–15.0)
Immature Granulocytes: 1 %
Lymphocytes Relative: 14 %
Lymphs Abs: 1.1 10*3/uL (ref 0.7–4.0)
MCH: 30.1 pg (ref 26.0–34.0)
MCHC: 33.1 g/dL (ref 30.0–36.0)
MCV: 90.8 fL (ref 80.0–100.0)
Monocytes Absolute: 0.7 10*3/uL (ref 0.1–1.0)
Monocytes Relative: 9 %
Neutro Abs: 5.6 10*3/uL (ref 1.7–7.7)
Neutrophils Relative %: 73 %
Platelets: 216 10*3/uL (ref 150–400)
RBC: 3.59 MIL/uL — ABNORMAL LOW (ref 3.87–5.11)
RDW: 13 % (ref 11.5–15.5)
WBC: 7.7 10*3/uL (ref 4.0–10.5)
nRBC: 0 % (ref 0.0–0.2)

## 2021-07-23 LAB — COMPREHENSIVE METABOLIC PANEL
ALT: 22 U/L (ref 0–44)
AST: 27 U/L (ref 15–41)
Albumin: 3.1 g/dL — ABNORMAL LOW (ref 3.5–5.0)
Alkaline Phosphatase: 81 U/L (ref 38–126)
Anion gap: 9 (ref 5–15)
BUN: 59 mg/dL — ABNORMAL HIGH (ref 8–23)
CO2: 19 mmol/L — ABNORMAL LOW (ref 22–32)
Calcium: 8.4 mg/dL — ABNORMAL LOW (ref 8.9–10.3)
Chloride: 105 mmol/L (ref 98–111)
Creatinine, Ser: 4.38 mg/dL — ABNORMAL HIGH (ref 0.44–1.00)
GFR, Estimated: 11 mL/min — ABNORMAL LOW (ref >60)
Glucose, Bld: 221 mg/dL — ABNORMAL HIGH (ref 70–99)
Potassium: 4.8 mmol/L (ref 3.5–5.1)
Sodium: 133 mmol/L — ABNORMAL LOW (ref 135–145)
Total Bilirubin: 0.7 mg/dL (ref 0.3–1.2)
Total Protein: 7 g/dL (ref 6.5–8.1)

## 2021-07-23 LAB — TROPONIN I (HIGH SENSITIVITY): Troponin I (High Sensitivity): 12 ng/L (ref <18)

## 2021-07-23 LAB — BRAIN NATRIURETIC PEPTIDE: B Natriuretic Peptide: 210.5 pg/mL — ABNORMAL HIGH (ref 0.0–100.0)

## 2021-07-23 LAB — CBG MONITORING, ED: Glucose-Capillary: 220 mg/dL — ABNORMAL HIGH (ref 70–99)

## 2021-07-23 MED ORDER — INSULIN ASPART 100 UNIT/ML IJ SOLN
0.0000 [IU] | INTRAMUSCULAR | Status: DC
  Administered 2021-07-23: 5 [IU] via SUBCUTANEOUS
  Filled 2021-07-23: qty 1

## 2021-07-23 MED ORDER — CLONIDINE HCL 0.1 MG PO TABS
0.3000 mg | ORAL_TABLET | Freq: Two times a day (BID) | ORAL | Status: DC
  Administered 2021-07-23: 0.3 mg via ORAL
  Filled 2021-07-23: qty 3

## 2021-07-23 MED ORDER — METOPROLOL TARTRATE 25 MG PO TABS
12.5000 mg | ORAL_TABLET | Freq: Every day | ORAL | Status: DC
  Administered 2021-07-23: 12.5 mg via ORAL
  Filled 2021-07-23: qty 1

## 2021-07-23 NOTE — ED Provider Notes (Signed)
[facility] Provider Note    Event Date/Time   First MD Initiated Contact with Patient 07/23/21 1828     (approximate)   History   Shortness of Breath   HPI  Amanda Montgomery is a 66 y.o. female  with history of hypertension, hyperlipidemia, insulin-dependent diabetes mellitus, hypothyroid, GERD, neuropathy, iron deficiency anemia, and recent admission 1/27-1/9 for evaluation of hypertensive urgency discharged with increased hydralazine dosing from 25 to 50 mg 3 times daily who presents via EMS from home for evaluation of shortness of breath palpitations and elevated blood sugars.  Patient states she felt more short of breath than usual earlier afternoon.  This in the setting of some fairly significant dyspnea with exertion since leaving the hospital.  She endorses some chest tightness but denies any back pain, abdominal pain, nausea, vomiting, fevers, diarrhea, urinary symptoms rash or extremity pain.  She denies any headache, earache, sore throat or any other sick symptoms.  She states she is compliant with all her medications including her blood pressure medications.  She denies tobacco abuse, EtOH use or illicit drug use.  Denies any known history of CHF.  No other acute concerns at this time.      Physical Exam  Triage Vital Signs: ED Triage Vitals  Enc Vitals Group     BP 07/23/21 1823 (!) 221/90     Pulse Rate 07/23/21 1823 69     Resp 07/23/21 1823 17     Temp 07/23/21 1823 98.5 F (36.9 C)     Temp Source 07/23/21 1823 Oral     SpO2 07/23/21 1815 100 %     Weight 07/23/21 1821 270 lb (122.5 kg)     Height 07/23/21 1821 5\' 9"  (1.753 m)     Head Circumference --      Peak Flow --      Pain Score 07/23/21 1821 0     Pain Loc --      Pain Edu? --      Excl. in [place]? --     Most recent vital signs: Vitals:   07/23/21 2000 07/23/21 2005  BP:    Pulse: 70 63  Resp: 19 17  Temp:    SpO2: 100% 100%    General: Awake, appears  uncomfortable. CV:  Good peripheral perfusion.  2+ radial pulses. Resp:  Normal effort.  Clear bilaterally. Abd:  No distention.  Soft throughout. Other:  Mild bilateral lower extremity edema.   ED Results / Procedures / Treatments  Labs (all labs ordered are listed, but only abnormal results are displayed) Labs Reviewed  BRAIN NATRIURETIC PEPTIDE - Abnormal; Notable for the following components:      Result Value   B Natriuretic Peptide 210.5 (*)    All other components within normal limits  COMPREHENSIVE METABOLIC PANEL - Abnormal; Notable for the following components:   Sodium 133 (*)    CO2 19 (*)    Glucose, Bld 221 (*)    BUN 59 (*)    Creatinine, Ser 4.38 (*)    Calcium 8.4 (*)    Albumin 3.1 (*)    GFR, Estimated 11 (*)    All other components within normal limits  CBC WITH DIFFERENTIAL/PLATELET - Abnormal; Notable for the following components:   RBC 3.59 (*)    Hemoglobin 10.8 (*)    HCT 32.6 (*)    All other components within normal limits  CBG MONITORING, ED - Abnormal; Notable for the following components:  Glucose-Capillary 220 (*)    All other components within normal limits  RESP PANEL BY RT-PCR (FLU A&B, COVID) [id number]  TROPONIN I (HIGH SENSITIVITY)  TROPONIN I (HIGH SENSITIVITY)     EKG  ECG is remarkable sinus rhythm with a ventricular rate of 71, with some sinus arrhythmia, unremarkable intervals, normal axis, without clear evidence of acute ischemia or significant arrhythmia.   RADIOLOGY  Chest x-ray ordered and interpreted by myself shows some stable cardiomegaly and very mild vascular congestion without clear new focal consolidation, effusion pneumothorax or other clear acute process.  I also reviewed radiologist interpretation and agree with the findings.   PROCEDURES:  Critical Care performed: No  .1-3 Lead EKG Interpretation Performed by: Lucrezia Starch, MD Authorized by: Lucrezia Starch, MD     Interpretation: normal     ECG rate  assessment: normal     Rhythm: sinus rhythm     Ectopy: none     Conduction: normal    The patient is on the cardiac monitor to evaluate for evidence of arrhythmia and/or significant heart rate changes.   MEDICATIONS ORDERED IN ED: Medications  cloNIDine (CATAPRES) tablet 0.3 mg (0.3 mg Oral Given 07/23/21 1850)  insulin aspart (novoLOG) injection 0-15 Units (5 Units Subcutaneous Given 07/23/21 1911)  metoprolol tartrate (LOPRESSOR) tablet 12.5 mg (12.5 mg Oral Given 07/23/21 2011)     IMPRESSION / MDM / ASSESSMENT AND PLAN / ED COURSE  I reviewed the triage vital signs and the nursing notes.                              Differential diagnosis includes, but is not limited to hypertensive urgency, CHF, ACS, pneumonia, bronchitis, anemia, arrhythmia, metabolic derangements with lower suspicion for PE as patient is not tachycardic tachypneic and has an SPO2 of 100% and is fairly hypertensive with other etiologies more concerning at this time.    ECG is remarkable sinus rhythm with a ventricular rate of 71, with some sinus arrhythmia, unremarkable intervals, normal axis, without clear evidence of acute ischemia or significant arrhythmia.  Nonelevated troponin obtained greater than 3 hours after symptom onset is not suggestive of ACS or myocarditis.  Chest x-ray ordered and interpreted by myself shows some stable cardiomegaly and very mild vascular congestion without clear new focal consolidation, effusion pneumothorax or other clear acute process.  I also reviewed radiologist interpretation and agree with the findings.  BNP is 210 compared to 566 4 days ago and given absence of any new edema on chest x-ray hypoxia or increased risk for distress have a low suspicion for clinically significant acute CHF exacerbation at this time.  CMP is remarkable for some mild hyperglycemia with a glucose of 221 and stable CKD with a creatinine of 4.38 compared to 4.293 days ago.  Anion gap is 9.  Glucose is  221.  CBC shows no leukocytosis and stable anemia.  COVID influenza PCR is negative.  Patient was ambulated in the emergency room and had no evidence of hypoxia able to maintain SPO2 greater than 95%.  Patient given dose of her home blood pressure medicines and on reassessment states she is feeling much better.  Blood pressure slightly improved.  It is possible symptoms related to some mild hypertensive urgency although there is no evidence of endorgan damage and I have low suspicion for other immediate life-threatening process.  I considered admission for further diagnostic studies or observation although at this point  given reassuring work-up and absence of hypoxia or respiratory distress and patient feeling much better I think she is stable for discharge for continued outpatient evaluation.  Discharged stable condition.  Strict and precautions advised and discussed     FINAL CLINICAL IMPRESSION(S) / ED DIAGNOSES   Final diagnoses:  SOB (shortness of breath)  Chronic kidney disease, unspecified CKD stage  Chest tightness  Hypertension, unspecified type     Rx / DC Orders   ED Discharge Orders     None        Note:  This document was prepared using Dragon voice recognition software and may include unintentional dictation errors.   Lucrezia Starch, MD 07/23/21 2105

## 2021-07-23 NOTE — ED Triage Notes (Addendum)
Pt BIB EMS from home c/o SOB, heart racing started today, no chest pain. Glucose= 300 on insulin. Pt states "I feel like when I take Hydralazine, I couldn't breath." Was just admitted here Fri-Sun for same per pt. Pt states she feels anxious at the moment and she received Ativan the last time she was here.  170/90 88 100% RA Rt wrist 20g  400 NS given

## 2021-10-20 ENCOUNTER — Other Ambulatory Visit: Payer: Self-pay

## 2021-10-20 ENCOUNTER — Encounter: Payer: Self-pay | Admitting: Emergency Medicine

## 2021-10-20 ENCOUNTER — Emergency Department
Admission: EM | Admit: 2021-10-20 | Discharge: 2021-10-20 | Disposition: A | Discharge status: Home / Self Care | Payer: 59 | Attending: Emergency Medicine | Admitting: Emergency Medicine

## 2021-10-20 ENCOUNTER — Emergency Department: Payer: 59

## 2021-10-20 DIAGNOSIS — K5900 Constipation, unspecified: Secondary | ICD-10-CM

## 2021-10-20 DIAGNOSIS — K648 Other hemorrhoids: Secondary | ICD-10-CM | POA: Insufficient documentation

## 2021-10-20 DIAGNOSIS — E1122 Type 2 diabetes mellitus with diabetic chronic kidney disease: Secondary | ICD-10-CM | POA: Diagnosis not present

## 2021-10-20 DIAGNOSIS — N189 Chronic kidney disease, unspecified: Secondary | ICD-10-CM | POA: Insufficient documentation

## 2021-10-20 DIAGNOSIS — K625 Hemorrhage of anus and rectum: Secondary | ICD-10-CM

## 2021-10-20 DIAGNOSIS — I129 Hypertensive chronic kidney disease with stage 1 through stage 4 chronic kidney disease, or unspecified chronic kidney disease: Secondary | ICD-10-CM | POA: Insufficient documentation

## 2021-10-20 LAB — CBC WITH DIFFERENTIAL/PLATELET
Abs Immature Granulocytes: 0.03 10*3/uL (ref 0.00–0.07)
Basophils Absolute: 0 10*3/uL (ref 0.0–0.1)
Basophils Relative: 1 %
Eosinophils Absolute: 0.2 10*3/uL (ref 0.0–0.5)
Eosinophils Relative: 4 %
HCT: 27 % — ABNORMAL LOW (ref 36.0–46.0)
Hemoglobin: 8.6 g/dL — ABNORMAL LOW (ref 12.0–15.0)
Immature Granulocytes: 1 %
Lymphocytes Relative: 13 %
Lymphs Abs: 0.8 10*3/uL (ref 0.7–4.0)
MCH: 29.4 pg (ref 26.0–34.0)
MCHC: 31.9 g/dL (ref 30.0–36.0)
MCV: 92.2 fL (ref 80.0–100.0)
Monocytes Absolute: 0.5 10*3/uL (ref 0.1–1.0)
Monocytes Relative: 8 %
Neutro Abs: 4.7 10*3/uL (ref 1.7–7.7)
Neutrophils Relative %: 73 %
Platelets: 230 10*3/uL (ref 150–400)
RBC: 2.93 MIL/uL — ABNORMAL LOW (ref 3.87–5.11)
RDW: 12.2 % (ref 11.5–15.5)
WBC: 6.3 10*3/uL (ref 4.0–10.5)
nRBC: 0 % (ref 0.0–0.2)

## 2021-10-20 LAB — COMPREHENSIVE METABOLIC PANEL
ALT: 14 U/L (ref 0–44)
AST: 17 U/L (ref 15–41)
Albumin: 3.3 g/dL — ABNORMAL LOW (ref 3.5–5.0)
Alkaline Phosphatase: 63 U/L (ref 38–126)
Anion gap: 7 (ref 5–15)
BUN: 76 mg/dL — ABNORMAL HIGH (ref 8–23)
CO2: 25 mmol/L (ref 22–32)
Calcium: 8.8 mg/dL — ABNORMAL LOW (ref 8.9–10.3)
Chloride: 106 mmol/L (ref 98–111)
Creatinine, Ser: 6 mg/dL — ABNORMAL HIGH (ref 0.44–1.00)
GFR, Estimated: 7 mL/min — ABNORMAL LOW (ref >60)
Glucose, Bld: 126 mg/dL — ABNORMAL HIGH (ref 70–99)
Potassium: 5.3 mmol/L — ABNORMAL HIGH (ref 3.5–5.1)
Sodium: 138 mmol/L (ref 135–145)
Total Bilirubin: 0.8 mg/dL (ref 0.3–1.2)
Total Protein: 7.1 g/dL (ref 6.5–8.1)

## 2021-10-20 LAB — TYPE AND SCREEN
ABO/RH(D): A POS
Antibody Screen: NEGATIVE

## 2021-10-20 LAB — PROTIME-INR
INR: 1.1 (ref 0.8–1.2)
Prothrombin Time: 13.7 seconds (ref 11.4–15.2)

## 2021-10-20 MED ORDER — MINERAL OIL RE ENEM
1.0000 | ENEMA | Freq: Once | RECTAL | Status: AC
  Administered 2021-10-20: 1 via RECTAL

## 2021-10-20 MED ORDER — LIDOCAINE VISCOUS HCL 2 % MT SOLN
15.0000 mL | Freq: Once | OROMUCOSAL | Status: AC
Start: 2021-10-20 — End: 2021-10-20
  Administered 2021-10-20: 15 mL via OROMUCOSAL
  Filled 2021-10-20: qty 15

## 2021-10-20 MED ORDER — POLYETHYLENE GLYCOL 3350 17 G PO PACK
17.0000 g | PACK | Freq: Every day | ORAL | Status: DC
  Administered 2021-10-20: 17 g via ORAL
  Filled 2021-10-20: qty 1

## 2021-10-20 MED ORDER — DEXAMETHASONE SODIUM PHOSPHATE 10 MG/ML IJ SOLN: 20.0000 mg | Freq: Once | INTRAMUSCULAR | Status: DC

## 2021-10-20 MED ORDER — DIBUCAINE (PERIANAL) 1 % EX OINT
TOPICAL_OINTMENT | CUTANEOUS | Status: DC | PRN
  Filled 2021-10-20: qty 28

## 2021-10-20 MED ORDER — HYDROCORTISONE ACETATE 25 MG RE SUPP: 25.0000 mg | Freq: Two times a day (BID) | RECTAL | 0 refills | Status: AC

## 2021-10-20 MED ORDER — DIBUCAINE (PERIANAL) 1 % EX OINT: 1.0000 "application " | TOPICAL_OINTMENT | Freq: Three times a day (TID) | CUTANEOUS | 0 refills | Status: AC | PRN

## 2021-10-20 NOTE — Discharge Instructions (Signed)
Your exam and labs are stable at this time.  Further evidence of your chronic kidney disease is noted.  You should follow-up with your nephrologist as scheduled.  Your x-ray revealed a moderate stool burden.  You should increase your fiber intake and consider taking MiraLAX up to 3 times daily to promote a normal stool.  Follow-up with your primary provider return to the ED if needed. ?

## 2021-10-20 NOTE — ED Provider Notes (Addendum)
? ? ?[facility] ?Emergency Department Provider Note ? ? ? ? Event Date/Time  ? First MD Initiated Contact with Patient 10/20/21 1549   ?  (approximate) ? ? ?History  ? ?Abdominal Pain and Rectal Bleeding ? ? ?HPI ? ?Amanda Montgomery is a 66 y.o. female with a history of CKD, type 2 diabetes, hypertension, and OSA, presents to the ED for low abdominal discomfort and constipation.  Patient also reports some acute rectal pain and bright red blood per rectum.  She has not had a meaningful bowel movement since Thursday.  She apparently took a dose of MiraLAX and use a rectal suppository this morning.  She been experience some acute rectal bleeding after she attempted to pass stool.  She denies any chest pain, shortness of breath, syncope, or fevers. ?  ? ? ?Physical Exam  ? ?Triage Vital Signs: ?ED Triage Vitals  ?Enc Vitals Group  ?   BP 10/20/21 1551 (!) 150/89  ?   Pulse Rate 10/20/21 1551 74  ?   Resp 10/20/21 1551 18  ?   Temp 10/20/21 1551 98.9 ?F (37.2 ?C)  ?   Temp Source 10/20/21 1551 Oral  ?   SpO2 10/20/21 1547 100 %  ?   Weight 10/20/21 1552 262 lb (118.8 kg)  ?   Height 10/20/21 1552 5\' 9"  (1.753 m)  ?   Head Circumference --   ?   Peak Flow --   ?   Pain Score 10/20/21 1552 6  ?   Pain Loc --   ?   Pain Edu? --   ?   Excl. in [place]? --   ? ? ?Most recent vital signs: ?Vitals:  ? 10/20/21 1547 10/20/21 1551  ?BP:  (!) 150/89  ?Pulse:  74  ?Resp:  18  ?Temp:  98.9 ?F (37.2 ?C)  ?SpO2: 100% 100%  ? ? ?General Awake, no distress.  ?CV:  Good peripheral perfusion.  ?RESP:  Normal effort.  ?ABD:  No distention. Scant BRB noted at rectum with manual disimpaction. Internal hemorrhoids noted. Normal rectal tone ? ? ?ED Results / Procedures / Treatments  ? ?Labs ?(all labs ordered are listed, but only abnormal results are displayed) ?Labs Reviewed  ?COMPREHENSIVE METABOLIC PANEL - Abnormal; Notable for the following components:  ?    Result Value  ? Potassium 5.3 (*)   ? Glucose, Bld 126 (*)   ?  BUN 76 (*)   ? Creatinine, Ser 6.00 (*)   ? Calcium 8.8 (*)   ? Albumin 3.3 (*)   ? GFR, Estimated 7 (*)   ? All other components within normal limits  ?CBC WITH DIFFERENTIAL/PLATELET - Abnormal; Notable for the following components:  ? RBC 2.93 (*)   ? Hemoglobin 8.6 (*)   ? HCT 27.0 (*)   ? All other components within normal limits  ?PROTIME-INR  ?TYPE AND SCREEN  ? ? ? ?EKG ? ? ?RADIOLOGY ? ?I personally viewed and evaluated these images as part of my medical decision making, as well as reviewing the written report by the radiologist. ? ?ED Provider Interpretation: moderate stool burden} ? ?DG Abdomen 1 View ? ?Result Date: 10/20/2021 ?CLINICAL DATA:  Constipation. EXAM: ABDOMEN - 1 VIEW COMPARISON:  None. FINDINGS: The bowel gas pattern is normal. A large amount of stool is seen throughout the colon. No radio-opaque calculi or other significant radiographic abnormality are seen. IMPRESSION: Large stool burden without evidence of bowel obstruction. Electronically Signed  By: Virgina Norfolk M.D.   On: 10/20/2021 17:07   ? ? ?PROCEDURES: ? ?Critical Care performed: No ? ?Fecal disimpaction ? ?Date/Time: 10/20/2021 5:30 PM ?Performed by: Melvenia Needles, PA-C ?Authorized by: Melvenia Needles, PA-C  ?Consent: Verbal consent obtained. Written consent not obtained. ?Risks and benefits: risks, benefits and alternatives were discussed ?Consent given by: patient ?Patient understanding: patient states understanding of the procedure being performed ?Patient consent: the patient's understanding of the procedure matches consent given ?Test results: test results available and properly labeled ?Imaging studies: imaging studies available ?Required items: required blood products, implants, devices, and special equipment available ?Patient identity confirmed: verbally with patient ?Local anesthesia used: no ? ?Anesthesia: ?Local anesthesia used: no ? ?Sedation: ?Patient sedated: no ? ?Patient tolerance: patient  tolerated the procedure well with no immediate complications ?Comments: Scant BRB noted at rectum with manual disimpaction. Internal hemorrhoids noted. Normal rectal tone. Several firm, brown stool balls removed manually.  ? ? ? ? ?MEDICATIONS ORDERED IN ED: ?Medications  ?polyethylene glycol (MIRALAX / GLYCOLAX) packet 17 g (17 g Oral Given 10/20/21 1755)  ?dibucaine (NUPERCAINAL) 1 % rectal ointment (has no administration in time range)  ?mineral oil enema 1 enema (1 enema Rectal Given 10/20/21 1755)  ?lidocaine (XYLOCAINE) 2 % viscous mouth solution 15 mL (15 mLs Mouth/Throat Given 10/20/21 1830)  ? ? ? ?IMPRESSION / MDM / ASSESSMENT AND PLAN / ED COURSE  ?I reviewed the triage vital signs and the nursing notes. ?             ?               ? ?Differential diagnosis includes, but is not limited to, ovarian cyst, ovarian torsion, acute appendicitis, diverticulitis, urinary tract infection/pyelonephritis, endometriosis, bowel obstruction, colitis, renal colic, gastroenteritis, hernia, etc. ? ? ?Patient to the ED with acute rectal pain and pressure with reports of constipation.  Patient presents to the ED for evaluation of complaints.  She is found to be acutely tender to the rectum.  Patient attempted a spontaneous stool while in the ED, but was unsuccessful.  She consented to a manual disimpaction which was performed with firm brown stool pull from the rectal vault.  A mineral oil suppository was used intermittently.  Patient with some spontaneous passage following disimpaction.  She is noting improvement of her symptoms overall.  Patient's diagnosis is consistent with constipation and rectal impaction. Patient will be discharged home with prescriptions for Dibucaine and Anusol suppositories.  He is also encouraged to take MiraLAX 3 times daily to promote a meaningful bowel cleanout.  Patient is to follow up with her primary provider as needed or otherwise directed. Patient is given ED precautions to return to the  ED for any worsening or new symptoms. ? ? ?FINAL CLINICAL IMPRESSION(S) / ED DIAGNOSES  ? ?Final diagnoses:  ?Constipation, unspecified constipation type  ?Rectal bleeding  ? ? ? ?Rx / DC Orders  ? ?ED Discharge Orders   ? ?      Ordered  ?  dibucaine (NUPERCAINAL) 1 % OINT  3 times daily PRN       ? 10/20/21 2024  ?  hydrocortisone (ANUSOL-HC) 25 MG suppository  Every 12 hours       ? 10/20/21 2024  ? ?  ?  ? ?  ? ? ? ?Note:  This document was prepared using Dragon voice recognition software and may include unintentional dictation errors. ? ?  ?Sayaka Hoeppner, Dannielle Karvonen, PA-C ?  10/21/21 0014 ? ?  ?Melvenia Needles, PA-C ?10/21/21 1532 ? ?  ?Lucrezia Starch, MD ?10/21/21 1948 ? ?

## 2021-10-20 NOTE — ED Triage Notes (Signed)
Patient to ED via [facility] for home for adb pain and rectal bleeding. Patient states she has not had a BM since Thursday and took a suppository/Miralax this AM. Pain and bleeding started after taking medications. ?
